# Patient Record
Sex: Male | Born: 1960 | Race: Black or African American | Hispanic: No | Marital: Single | Smoking: Current every day smoker
Health system: Southern US, Community
[De-identification: ages and names within clinical notes are randomized; demographics above are authoritative.]

## PROBLEM LIST (undated history)

## (undated) DIAGNOSIS — I10 Essential (primary) hypertension: Secondary | ICD-10-CM

## (undated) DIAGNOSIS — I639 Cerebral infarction, unspecified: Secondary | ICD-10-CM

## (undated) HISTORY — PX: BRAIN SURGERY: SHX531

## (undated) HISTORY — PX: NECK SURGERY: SHX720

## (undated) HISTORY — PX: ABDOMINAL SURGERY: SHX537

---

## 2009-01-13 ENCOUNTER — Emergency Department (HOSPITAL_COMMUNITY): Admission: EM | Admit: 2009-01-13 | Discharge: 2009-01-13 | Payer: Self-pay | Admitting: *Deleted

## 2009-01-15 ENCOUNTER — Emergency Department (HOSPITAL_COMMUNITY): Admission: EM | Admit: 2009-01-15 | Discharge: 2009-01-16 | Payer: Self-pay | Admitting: Emergency Medicine

## 2009-01-29 ENCOUNTER — Emergency Department (HOSPITAL_COMMUNITY): Admission: EM | Admit: 2009-01-29 | Discharge: 2009-01-29 | Payer: Self-pay | Admitting: Emergency Medicine

## 2009-01-31 ENCOUNTER — Emergency Department (HOSPITAL_COMMUNITY): Admission: EM | Admit: 2009-01-31 | Discharge: 2009-01-31 | Payer: Self-pay | Admitting: Emergency Medicine

## 2009-04-27 ENCOUNTER — Emergency Department (HOSPITAL_COMMUNITY): Admission: EM | Admit: 2009-04-27 | Discharge: 2009-04-27 | Payer: Self-pay | Admitting: Emergency Medicine

## 2010-02-17 ENCOUNTER — Emergency Department (HOSPITAL_COMMUNITY)
Admission: EM | Admit: 2010-02-17 | Discharge: 2010-02-17 | Payer: Self-pay | Source: Home / Self Care | Admitting: Emergency Medicine

## 2010-02-20 ENCOUNTER — Emergency Department (HOSPITAL_COMMUNITY)
Admission: EM | Admit: 2010-02-20 | Discharge: 2010-02-20 | Payer: Self-pay | Source: Home / Self Care | Admitting: Emergency Medicine

## 2010-02-24 ENCOUNTER — Emergency Department (HOSPITAL_COMMUNITY): Admission: EM | Admit: 2010-02-24 | Discharge: 2010-02-24 | Payer: Self-pay | Admitting: Family Medicine

## 2010-04-08 ENCOUNTER — Emergency Department (HOSPITAL_COMMUNITY): Admission: EM | Admit: 2010-04-08 | Discharge: 2010-04-08 | Payer: Self-pay | Admitting: Emergency Medicine

## 2010-05-23 ENCOUNTER — Encounter (INDEPENDENT_AMBULATORY_CARE_PROVIDER_SITE_OTHER): Payer: Self-pay

## 2010-05-23 ENCOUNTER — Inpatient Hospital Stay (HOSPITAL_COMMUNITY): Admission: AC | Admit: 2010-05-23 | Discharge: 2010-06-09 | Payer: Self-pay

## 2010-05-31 ENCOUNTER — Encounter (INDEPENDENT_AMBULATORY_CARE_PROVIDER_SITE_OTHER): Payer: Self-pay | Admitting: Surgery

## 2010-06-05 ENCOUNTER — Ambulatory Visit: Payer: Self-pay | Admitting: Physical Medicine & Rehabilitation

## 2010-07-04 ENCOUNTER — Ambulatory Visit: Payer: Self-pay | Admitting: Psychiatry

## 2010-07-05 ENCOUNTER — Encounter (HOSPITAL_BASED_OUTPATIENT_CLINIC_OR_DEPARTMENT_OTHER)
Admission: RE | Admit: 2010-07-05 | Discharge: 2010-07-14 | Payer: Self-pay | Source: Home / Self Care | Admitting: General Surgery

## 2010-07-14 ENCOUNTER — Emergency Department (HOSPITAL_COMMUNITY)
Admission: EM | Admit: 2010-07-14 | Discharge: 2010-07-14 | Payer: Self-pay | Source: Home / Self Care | Admitting: Emergency Medicine

## 2010-09-04 ENCOUNTER — Encounter
Admission: RE | Admit: 2010-09-04 | Discharge: 2010-09-04 | Payer: Self-pay | Source: Home / Self Care | Attending: Neurosurgery | Admitting: Neurosurgery

## 2010-09-04 ENCOUNTER — Encounter: Admission: RE | Admit: 2010-09-04 | Payer: Self-pay | Source: Home / Self Care | Admitting: Neurosurgery

## 2010-09-04 ENCOUNTER — Emergency Department (HOSPITAL_COMMUNITY)
Admission: EM | Admit: 2010-09-04 | Discharge: 2010-09-04 | Payer: Self-pay | Source: Home / Self Care | Admitting: Emergency Medicine

## 2010-09-05 LAB — COMPREHENSIVE METABOLIC PANEL
ALT: 33 U/L (ref 0–53)
AST: 31 U/L (ref 0–37)
Alkaline Phosphatase: 100 U/L (ref 39–117)
CO2: 26 mEq/L (ref 19–32)
Chloride: 101 mEq/L (ref 96–112)
Creatinine, Ser: 0.84 mg/dL (ref 0.4–1.5)
GFR calc Af Amer: >60 mL/min (ref >60)
GFR calc non Af Amer: >60 mL/min (ref >60)
Potassium: 3.8 mEq/L (ref 3.5–5.1)
Total Bilirubin: 0.2 mg/dL — ABNORMAL LOW (ref 0.3–1.2)

## 2010-09-05 LAB — URINALYSIS, ROUTINE W REFLEX MICROSCOPIC
Ketones, ur: 15 mg/dL — AB
Nitrite: POSITIVE — AB
Urobilinogen, UA: 1 mg/dL (ref 0.0–1.0)

## 2010-09-05 LAB — DIFFERENTIAL
Basophils Relative: 1 % (ref 0–1)
Lymphs Abs: 3.3 10*3/uL (ref 0.7–4.0)
Monocytes Relative: 9 % (ref 3–12)
Neutro Abs: 1.9 10*3/uL (ref 1.7–7.7)
Neutrophils Relative %: 33 % — ABNORMAL LOW (ref 43–77)

## 2010-09-05 LAB — CBC
Hemoglobin: 12.9 g/dL — ABNORMAL LOW (ref 13.0–17.0)
MCH: 31 pg (ref 26.0–34.0)
RBC: 4.16 MIL/uL — ABNORMAL LOW (ref 4.22–5.81)
WBC: 5.9 10*3/uL (ref 4.0–10.5)

## 2010-09-06 LAB — URINE CULTURE: Culture: NO GROWTH

## 2010-09-13 ENCOUNTER — Encounter: Payer: Self-pay | Admitting: Neurosurgery

## 2010-10-25 LAB — BASIC METABOLIC PANEL
BUN: 13 mg/dL (ref 6–23)
BUN: 15 mg/dL (ref 6–23)
BUN: 16 mg/dL (ref 6–23)
CO2: 25 mEq/L (ref 19–32)
CO2: 29 mEq/L (ref 19–32)
Calcium: 8 mg/dL — ABNORMAL LOW (ref 8.4–10.5)
Calcium: 8 mg/dL — ABNORMAL LOW (ref 8.4–10.5)
Calcium: 8.1 mg/dL — ABNORMAL LOW (ref 8.4–10.5)
Calcium: 8.3 mg/dL — ABNORMAL LOW (ref 8.4–10.5)
Chloride: 109 mEq/L (ref 96–112)
Chloride: 110 mEq/L (ref 96–112)
Chloride: 111 mEq/L (ref 96–112)
Creatinine, Ser: 0.77 mg/dL (ref 0.4–1.5)
Creatinine, Ser: 0.79 mg/dL (ref 0.4–1.5)
Creatinine, Ser: 0.83 mg/dL (ref 0.4–1.5)
GFR calc Af Amer: >60 mL/min (ref >60)
GFR calc Af Amer: >60 mL/min (ref >60)
GFR calc Af Amer: >60 mL/min (ref >60)
GFR calc Af Amer: >60 mL/min (ref >60)
GFR calc non Af Amer: >60 mL/min (ref >60)
GFR calc non Af Amer: >60 mL/min (ref >60)
GFR calc non Af Amer: >60 mL/min (ref >60)
GFR calc non Af Amer: >60 mL/min (ref >60)
GFR calc non Af Amer: >60 mL/min (ref >60)
GFR calc non Af Amer: >60 mL/min (ref >60)
Glucose, Bld: 110 mg/dL — ABNORMAL HIGH (ref 70–99)
Glucose, Bld: 118 mg/dL — ABNORMAL HIGH (ref 70–99)
Glucose, Bld: 133 mg/dL — ABNORMAL HIGH (ref 70–99)
Glucose, Bld: 136 mg/dL — ABNORMAL HIGH (ref 70–99)
Potassium: 3.2 mEq/L — ABNORMAL LOW (ref 3.5–5.1)
Potassium: 3.3 mEq/L — ABNORMAL LOW (ref 3.5–5.1)
Potassium: 3.5 mEq/L (ref 3.5–5.1)
Potassium: 3.7 mEq/L (ref 3.5–5.1)
Potassium: 3.9 mEq/L (ref 3.5–5.1)
Potassium: 4 mEq/L (ref 3.5–5.1)
Sodium: 136 mEq/L (ref 135–145)
Sodium: 141 mEq/L (ref 135–145)
Sodium: 142 mEq/L (ref 135–145)
Sodium: 142 mEq/L (ref 135–145)
Sodium: 144 mEq/L (ref 135–145)

## 2010-10-25 LAB — GLUCOSE, CAPILLARY
Glucose-Capillary: 103 mg/dL — ABNORMAL HIGH (ref 70–99)
Glucose-Capillary: 104 mg/dL — ABNORMAL HIGH (ref 70–99)
Glucose-Capillary: 104 mg/dL — ABNORMAL HIGH (ref 70–99)
Glucose-Capillary: 107 mg/dL — ABNORMAL HIGH (ref 70–99)
Glucose-Capillary: 108 mg/dL — ABNORMAL HIGH (ref 70–99)
Glucose-Capillary: 109 mg/dL — ABNORMAL HIGH (ref 70–99)
Glucose-Capillary: 111 mg/dL — ABNORMAL HIGH (ref 70–99)
Glucose-Capillary: 112 mg/dL — ABNORMAL HIGH (ref 70–99)
Glucose-Capillary: 114 mg/dL — ABNORMAL HIGH (ref 70–99)
Glucose-Capillary: 116 mg/dL — ABNORMAL HIGH (ref 70–99)
Glucose-Capillary: 119 mg/dL — ABNORMAL HIGH (ref 70–99)
Glucose-Capillary: 121 mg/dL — ABNORMAL HIGH (ref 70–99)
Glucose-Capillary: 122 mg/dL — ABNORMAL HIGH (ref 70–99)
Glucose-Capillary: 122 mg/dL — ABNORMAL HIGH (ref 70–99)
Glucose-Capillary: 122 mg/dL — ABNORMAL HIGH (ref 70–99)
Glucose-Capillary: 124 mg/dL — ABNORMAL HIGH (ref 70–99)
Glucose-Capillary: 127 mg/dL — ABNORMAL HIGH (ref 70–99)
Glucose-Capillary: 129 mg/dL — ABNORMAL HIGH (ref 70–99)
Glucose-Capillary: 132 mg/dL — ABNORMAL HIGH (ref 70–99)
Glucose-Capillary: 136 mg/dL — ABNORMAL HIGH (ref 70–99)
Glucose-Capillary: 146 mg/dL — ABNORMAL HIGH (ref 70–99)
Glucose-Capillary: 162 mg/dL — ABNORMAL HIGH (ref 70–99)
Glucose-Capillary: 191 mg/dL — ABNORMAL HIGH (ref 70–99)
Glucose-Capillary: 74 mg/dL (ref 70–99)
Glucose-Capillary: 91 mg/dL (ref 70–99)
Glucose-Capillary: 94 mg/dL (ref 70–99)

## 2010-10-25 LAB — BLOOD GAS, ARTERIAL
Acid-Base Excess: 2.1 mmol/L — ABNORMAL HIGH (ref 0.0–2.0)
Bicarbonate: 26.2 meq/L — ABNORMAL HIGH (ref 20.0–24.0)
FIO2: 40 %
Patient temperature: 98.6
TCO2: 27.4 mmol/L (ref 0–100)
pH, Arterial: 7.418 (ref 7.350–7.450)

## 2010-10-25 LAB — POCT I-STAT 4, (NA,K, GLUC, HGB,HCT)
HCT: 31 % — ABNORMAL LOW (ref 39.0–52.0)
Hemoglobin: 10.5 g/dL — ABNORMAL LOW (ref 13.0–17.0)
Potassium: 4.7 meq/L (ref 3.5–5.1)
Sodium: 135 meq/L (ref 135–145)

## 2010-10-25 LAB — CBC
HCT: 22.3 % — ABNORMAL LOW (ref 39.0–52.0)
HCT: 24.5 % — ABNORMAL LOW (ref 39.0–52.0)
HCT: 25.9 % — ABNORMAL LOW (ref 39.0–52.0)
HCT: 27.4 % — ABNORMAL LOW (ref 39.0–52.0)
HCT: 27.7 % — ABNORMAL LOW (ref 39.0–52.0)
Hemoglobin: 7.4 g/dL — ABNORMAL LOW (ref 13.0–17.0)
Hemoglobin: 8.1 g/dL — ABNORMAL LOW (ref 13.0–17.0)
Hemoglobin: 8.5 g/dL — ABNORMAL LOW (ref 13.0–17.0)
Hemoglobin: 8.8 g/dL — ABNORMAL LOW (ref 13.0–17.0)
Hemoglobin: 9 g/dL — ABNORMAL LOW (ref 13.0–17.0)
Hemoglobin: 9.1 g/dL — ABNORMAL LOW (ref 13.0–17.0)
Hemoglobin: 9.1 g/dL — ABNORMAL LOW (ref 13.0–17.0)
MCH: 33 pg (ref 26.0–34.0)
MCH: 33.8 pg (ref 26.0–34.0)
MCHC: 32.9 g/dL (ref 30.0–36.0)
MCHC: 33.8 g/dL (ref 30.0–36.0)
MCV: 102.5 fL — ABNORMAL HIGH (ref 78.0–100.0)
MCV: 99.6 fL (ref 78.0–100.0)
Platelets: 210 10*3/uL (ref 150–400)
Platelets: 574 10*3/uL — ABNORMAL HIGH (ref 150–400)
RBC: 2.39 MIL/uL — ABNORMAL LOW (ref 4.22–5.81)
RBC: 2.75 MIL/uL — ABNORMAL LOW (ref 4.22–5.81)
RBC: 2.76 MIL/uL — ABNORMAL LOW (ref 4.22–5.81)
RBC: 2.81 MIL/uL — ABNORMAL LOW (ref 4.22–5.81)
RDW: 12.7 % (ref 11.5–15.5)
RDW: 12.8 % (ref 11.5–15.5)
RDW: 13 % (ref 11.5–15.5)
RDW: 13 % (ref 11.5–15.5)
RDW: 13.2 % (ref 11.5–15.5)
WBC: 10.2 10*3/uL (ref 4.0–10.5)
WBC: 11 10*3/uL — ABNORMAL HIGH (ref 4.0–10.5)
WBC: 6.9 10*3/uL (ref 4.0–10.5)
WBC: 7.8 10*3/uL (ref 4.0–10.5)
WBC: 8.2 10*3/uL (ref 4.0–10.5)
WBC: 9.1 10*3/uL (ref 4.0–10.5)

## 2010-10-25 LAB — POCT I-STAT 3, ART BLOOD GAS (G3+)
Acid-Base Excess: 4 mmol/L — ABNORMAL HIGH (ref 0.0–2.0)
Patient temperature: 37.6
pH, Arterial: 7.424 (ref 7.350–7.450)
pO2, Arterial: 71 mmHg — ABNORMAL LOW (ref 80.0–100.0)

## 2010-10-25 LAB — MRSA PCR SCREENING: MRSA by PCR: NEGATIVE

## 2010-10-25 LAB — COMPREHENSIVE METABOLIC PANEL
Alkaline Phosphatase: 81 U/L (ref 39–117)
BUN: 16 mg/dL (ref 6–23)
CO2: 28 mEq/L (ref 19–32)
Chloride: 111 mEq/L (ref 96–112)
GFR calc non Af Amer: >60 mL/min (ref >60)
Glucose, Bld: 162 mg/dL — ABNORMAL HIGH (ref 70–99)
Potassium: 3.8 mEq/L (ref 3.5–5.1)
Total Bilirubin: 0.5 mg/dL (ref 0.3–1.2)

## 2010-10-26 LAB — CBC
HCT: 23.8 % — ABNORMAL LOW (ref 39.0–52.0)
HCT: 27.2 % — ABNORMAL LOW (ref 39.0–52.0)
Hemoglobin: 8 g/dL — ABNORMAL LOW (ref 13.0–17.0)
Hemoglobin: 9 g/dL — ABNORMAL LOW (ref 13.0–17.0)
MCH: 33.3 pg (ref 26.0–34.0)
MCH: 33.7 pg (ref 26.0–34.0)
MCH: 34 pg (ref 26.0–34.0)
MCHC: 33.1 g/dL (ref 30.0–36.0)
MCHC: 33.6 g/dL (ref 30.0–36.0)
MCHC: 33.7 g/dL (ref 30.0–36.0)
MCHC: 34.2 g/dL (ref 30.0–36.0)
MCHC: 35.1 g/dL (ref 30.0–36.0)
MCV: 100 fL (ref 78.0–100.0)
MCV: 101.3 fL — ABNORMAL HIGH (ref 78.0–100.0)
Platelets: 188 10*3/uL (ref 150–400)
RBC: 2.7 MIL/uL — ABNORMAL LOW (ref 4.22–5.81)
RBC: 3.44 MIL/uL — ABNORMAL LOW (ref 4.22–5.81)
RDW: 12.5 % (ref 11.5–15.5)
RDW: 12.6 % (ref 11.5–15.5)
RDW: 12.8 % (ref 11.5–15.5)
RDW: 12.8 % (ref 11.5–15.5)
WBC: 11.3 10*3/uL — ABNORMAL HIGH (ref 4.0–10.5)

## 2010-10-26 LAB — POCT I-STAT, CHEM 8
Calcium, Ion: 1 mmol/L — ABNORMAL LOW (ref 1.12–1.32)
Chloride: 106 meq/L (ref 96–112)
Glucose, Bld: 190 mg/dL — ABNORMAL HIGH (ref 70–99)
HCT: 47 % (ref 39.0–52.0)

## 2010-10-26 LAB — PROTIME-INR
INR: 1.16 (ref 0.00–1.49)
Prothrombin Time: 13.5 seconds (ref 11.6–15.2)
Prothrombin Time: 15 seconds (ref 11.6–15.2)

## 2010-10-26 LAB — DIFFERENTIAL
Basophils Absolute: 0 10*3/uL (ref 0.0–0.1)
Basophils Relative: 0 % (ref 0–1)
Basophils Relative: 0 % (ref 0–1)
Eosinophils Absolute: 0 10*3/uL (ref 0.0–0.7)
Eosinophils Absolute: 0 10*3/uL (ref 0.0–0.7)
Eosinophils Relative: 0 % (ref 0–5)
Lymphs Abs: 1.9 10*3/uL (ref 0.7–4.0)
Monocytes Absolute: 0.7 10*3/uL (ref 0.1–1.0)
Monocytes Absolute: 0.8 10*3/uL (ref 0.1–1.0)
Monocytes Relative: 8 % (ref 3–12)
Monocytes Relative: 9 % (ref 3–12)
Neutro Abs: 6.3 10*3/uL (ref 1.7–7.7)
Neutro Abs: 6.6 10*3/uL (ref 1.7–7.7)
Neutrophils Relative %: 69 % (ref 43–77)

## 2010-10-26 LAB — CULTURE, BAL-QUANTITATIVE W GRAM STAIN

## 2010-10-26 LAB — BASIC METABOLIC PANEL
BUN: 11 mg/dL (ref 6–23)
BUN: 14 mg/dL (ref 6–23)
BUN: 17 mg/dL (ref 6–23)
CO2: 27 mEq/L (ref 19–32)
CO2: 29 mEq/L (ref 19–32)
CO2: 29 mEq/L (ref 19–32)
Calcium: 7.6 mg/dL — ABNORMAL LOW (ref 8.4–10.5)
Calcium: 7.8 mg/dL — ABNORMAL LOW (ref 8.4–10.5)
Calcium: 7.9 mg/dL — ABNORMAL LOW (ref 8.4–10.5)
Chloride: 106 mEq/L (ref 96–112)
Creatinine, Ser: 1.14 mg/dL (ref 0.4–1.5)
Creatinine, Ser: 1.14 mg/dL (ref 0.4–1.5)
GFR calc Af Amer: >60 mL/min (ref >60)
GFR calc Af Amer: >60 mL/min (ref >60)
GFR calc non Af Amer: >60 mL/min (ref >60)
Glucose, Bld: 104 mg/dL — ABNORMAL HIGH (ref 70–99)
Glucose, Bld: 114 mg/dL — ABNORMAL HIGH (ref 70–99)
Glucose, Bld: 116 mg/dL — ABNORMAL HIGH (ref 70–99)
Glucose, Bld: 118 mg/dL — ABNORMAL HIGH (ref 70–99)
Potassium: 3.5 mEq/L (ref 3.5–5.1)
Potassium: 4.3 mEq/L (ref 3.5–5.1)
Sodium: 139 mEq/L (ref 135–145)
Sodium: 140 mEq/L (ref 135–145)

## 2010-10-26 LAB — POCT I-STAT 7, (LYTES, BLD GAS, ICA,H+H)
Acid-base deficit: 4 mmol/L — ABNORMAL HIGH (ref 0.0–2.0)
Bicarbonate: 22.4 meq/L (ref 20.0–24.0)
Calcium, Ion: 1.04 mmol/L — ABNORMAL LOW (ref 1.12–1.32)
HCT: 38 % — ABNORMAL LOW (ref 39.0–52.0)
HCT: 42 % (ref 39.0–52.0)
O2 Saturation: 94 %
Patient temperature: 35.5
Sodium: 142 meq/L (ref 135–145)
pCO2 arterial: 36 mmHg (ref 35.0–45.0)
pO2, Arterial: 69 mmHg — ABNORMAL LOW (ref 80.0–100.0)

## 2010-10-26 LAB — POCT I-STAT 3, ART BLOOD GAS (G3+)
Acid-Base Excess: 4 mmol/L — ABNORMAL HIGH (ref 0.0–2.0)
Acid-Base Excess: 4 mmol/L — ABNORMAL HIGH (ref 0.0–2.0)
Bicarbonate: 24.7 meq/L — ABNORMAL HIGH (ref 20.0–24.0)
Bicarbonate: 26.8 meq/L — ABNORMAL HIGH (ref 20.0–24.0)
O2 Saturation: 90 %
O2 Saturation: 92 %
O2 Saturation: 92 %
O2 Saturation: 96 %
O2 Saturation: 96 %
Patient temperature: 37.5
Patient temperature: 37.6
Patient temperature: 96.5
TCO2: 22 mmol/L (ref 0–100)
TCO2: 26 mmol/L (ref 0–100)
TCO2: 28 mmol/L (ref 0–100)
TCO2: 30 mmol/L (ref 0–100)
pCO2 arterial: 43.2 mmHg (ref 35.0–45.0)
pCO2 arterial: 50.5 mmHg — ABNORMAL HIGH (ref 35.0–45.0)
pH, Arterial: 7.367 (ref 7.350–7.450)
pH, Arterial: 7.422 (ref 7.350–7.450)
pO2, Arterial: 104 mmHg — ABNORMAL HIGH (ref 80.0–100.0)
pO2, Arterial: 61 mmHg — ABNORMAL LOW (ref 80.0–100.0)
pO2, Arterial: 85 mmHg (ref 80.0–100.0)

## 2010-10-26 LAB — COMPREHENSIVE METABOLIC PANEL
ALT: 70 U/L — ABNORMAL HIGH (ref 0–53)
AST: 84 U/L — ABNORMAL HIGH (ref 0–37)
Albumin: 3.7 g/dL (ref 3.5–5.2)
Calcium: 8.3 mg/dL — ABNORMAL LOW (ref 8.4–10.5)
GFR calc Af Amer: >60 mL/min (ref >60)
Potassium: 3.5 mEq/L (ref 3.5–5.1)
Sodium: 140 mEq/L (ref 135–145)
Total Protein: 7.9 g/dL (ref 6.0–8.3)

## 2010-10-26 LAB — URINALYSIS, ROUTINE W REFLEX MICROSCOPIC
Nitrite: NEGATIVE
Specific Gravity, Urine: 1.035 — ABNORMAL HIGH (ref 1.005–1.030)
pH: 6 (ref 5.0–8.0)

## 2010-10-26 LAB — LACTIC ACID, PLASMA: Lactic Acid, Venous: 4 mmol/L — ABNORMAL HIGH (ref 0.5–2.2)

## 2010-10-26 LAB — TYPE AND SCREEN
ABO/RH(D): O NEG
Antibody Screen: NEGATIVE

## 2010-10-26 LAB — URINE MICROSCOPIC-ADD ON

## 2010-10-26 LAB — HEMOGLOBIN AND HEMATOCRIT, BLOOD: HCT: 36.7 % — ABNORMAL LOW (ref 39.0–52.0)

## 2011-06-12 ENCOUNTER — Emergency Department (HOSPITAL_COMMUNITY)
Admission: EM | Admit: 2011-06-12 | Discharge: 2011-06-12 | Disposition: A | Discharge status: Home / Self Care | Payer: Medicare Other | Attending: Emergency Medicine | Admitting: Emergency Medicine

## 2011-06-12 DIAGNOSIS — M79609 Pain in unspecified limb: Secondary | ICD-10-CM

## 2011-06-12 DIAGNOSIS — I10 Essential (primary) hypertension: Secondary | ICD-10-CM | POA: Insufficient documentation

## 2011-06-12 DIAGNOSIS — Z79899 Other long term (current) drug therapy: Secondary | ICD-10-CM | POA: Insufficient documentation

## 2011-06-12 DIAGNOSIS — M25569 Pain in unspecified knee: Secondary | ICD-10-CM | POA: Insufficient documentation

## 2011-06-12 DIAGNOSIS — G8929 Other chronic pain: Secondary | ICD-10-CM | POA: Insufficient documentation

## 2011-06-12 DIAGNOSIS — M549 Dorsalgia, unspecified: Secondary | ICD-10-CM | POA: Insufficient documentation

## 2011-12-06 ENCOUNTER — Emergency Department (HOSPITAL_COMMUNITY)
Admission: EM | Admit: 2011-12-06 | Discharge: 2011-12-06 | Disposition: A | Discharge status: Home / Self Care | Payer: Medicare Other | Attending: Emergency Medicine | Admitting: Emergency Medicine

## 2011-12-06 ENCOUNTER — Encounter (HOSPITAL_COMMUNITY): Payer: Self-pay | Admitting: Emergency Medicine

## 2011-12-06 ENCOUNTER — Emergency Department (HOSPITAL_COMMUNITY): Payer: Medicare Other

## 2011-12-06 DIAGNOSIS — I10 Essential (primary) hypertension: Secondary | ICD-10-CM | POA: Insufficient documentation

## 2011-12-06 DIAGNOSIS — Z79899 Other long term (current) drug therapy: Secondary | ICD-10-CM | POA: Insufficient documentation

## 2011-12-06 DIAGNOSIS — M25519 Pain in unspecified shoulder: Secondary | ICD-10-CM | POA: Insufficient documentation

## 2011-12-06 DIAGNOSIS — M25512 Pain in left shoulder: Secondary | ICD-10-CM

## 2011-12-06 DIAGNOSIS — X500XXA Overexertion from strenuous movement or load, initial encounter: Secondary | ICD-10-CM | POA: Insufficient documentation

## 2011-12-06 HISTORY — DX: Essential (primary) hypertension: I10

## 2011-12-06 MED ORDER — OXYCODONE-ACETAMINOPHEN 5-325 MG PO TABS
1.0000 | ORAL_TABLET | Freq: Once | ORAL | Status: AC
  Administered 2011-12-06: 1 via ORAL
  Filled 2011-12-06: qty 1

## 2011-12-06 MED ORDER — OXYCODONE-ACETAMINOPHEN 5-325 MG PO TABS: 1.0000 | ORAL_TABLET | Freq: Four times a day (QID) | ORAL | Status: AC | PRN

## 2011-12-06 NOTE — ED Notes (Signed)
Pt c/o left shoulder pain after lifting a ladder yesterday; pt sts painful to touch or move

## 2011-12-06 NOTE — Discharge Instructions (Signed)
Return to the ED with any concerns including worsening pain, arm swelling, weakness or numbness of her arm, or any other alarming symptoms.  He should be sure to arrange for a followup appointment with your primary care provider as well as with orthopedic surgery if your symptoms persist.

## 2011-12-06 NOTE — ED Provider Notes (Signed)
History     CSN: [id number]  Arrival date & time 12/06/11  1119   First MD Initiated Contact with Patient 12/06/11 1200      Chief Complaint  Patient presents with  . Shoulder Pain    (Consider location/radiation/quality/duration/timing/severity/associated sxs/prior treatment) HPI Patient presents with pain in his left shoulder. He states that he was helping to lift gallbladder and felt that it was blowing against the wind which pulled on his left shoulder. He did not fall or have any direct trauma to the shoulder. Since yesterday his pain has been worsening and is in the anterior and posterior shoulder. It is worse with movement. He took an over-the-counter pain medication which did not help with his symptoms. He denies any swelling of arm. He has no neck or back pain. Movement and palpation make the pain worse. There no other alleviating or modifying factors. There no associated systemic symptoms.  Past Medical History  Diagnosis Date  . Hypertension     History reviewed. No pertinent past surgical history.  History reviewed. No pertinent family history.  History  Substance Use Topics  . Smoking status: Current Everyday Smoker  . Smokeless tobacco: Not on file  . Alcohol Use: No      Review of Systems ROS reviewed and all otherwise negative except for mentioned in HPI  Allergies  Review of patient's allergies indicates no known allergies.  Home Medications   Current Outpatient Rx  Name Route Sig Dispense Refill  . HYDROCHLOROTHIAZIDE 25 MG PO TABS Oral Take 25 mg by mouth daily.    Marland Kitchen NIFEDIPINE ER 60 MG PO TB24 Oral Take 60 mg by mouth daily.    . OXYCODONE-ACETAMINOPHEN 5-325 MG PO TABS Oral Take 1-2 tablets by mouth every 6 (six) hours as needed for pain. 15 tablet 0    BP 161/94  Pulse 85  Temp(Src) 97.6 F (36.4 C) (Oral)  Resp 18  SpO2 94% Vitals reviewed Physical Exam Physical Examination: General appearance - alert, well appearing, and in no  distress Mental status - alert, oriented to person, place, and time Neck - supple, no midline ttp with FROM Chest - clear to auscultation, no wheezes, rales or rhonchi, symmetric air entry Heart - normal rate, regular rhythm, normal S1, S2, no murmurs, rubs, clicks or gallops Back exam - no midline ttp of c/t/l spine Neurological - alert, oriented, normal speech, full strength 5/5 in bilateral upper extremities, sensation distally instact Musculoskeletal - ttp diffusely about left shoulder joint, no specific bony point tenderness, no ttp of AC joint, pain with passive and active ROM,  no deformity or swelling Extremities - peripheral pulses normal, no pedal edema, no clubbing or cyanosis Skin - normal coloration and turgor, no rashes ED Course  Procedures (including critical care time)  Labs Reviewed - No data to display Dg Shoulder Left  12/06/2011  *RADIOLOGY REPORT*  Clinical Data: Work injury.  Swelling with limited mobility.  LEFT SHOULDER - 2+ VIEW  Comparison:  01/13/2009  Findings:  There is no evidence of fracture or dislocation.  There is no evidence of arthropathy or other focal bone abnormality. Soft tissues are unremarkable. No change from priors.  IMPRESSION: Negative.  Original Report Authenticated By: Elsie Stain, M.D.     1. Shoulder pain, left   2. Hypertension       MDM  Patient presenting with pain in his left shoulder after a strain on the shoulder lifting a ladder yesterday. His x-ray shows no acute bony  abnormality. He was given pain medication in the emergency department and is discharged with strict return precautions. He is agreeable with this plan. He was also given information for orthopedic followup if symptoms persist.        Ethelda Chick, MD 12/06/11 1254

## 2012-01-18 IMAGING — CR DG HAND COMPLETE 3+V*L*
3 series · 3 of 3 positions shown · non-contrast
Comparison: Left wrist films

CLINICAL DATA: Left hand pain with swelling, fall

LEFT HAND - COMPLETE 3+ VIEW

[x hand pa left]
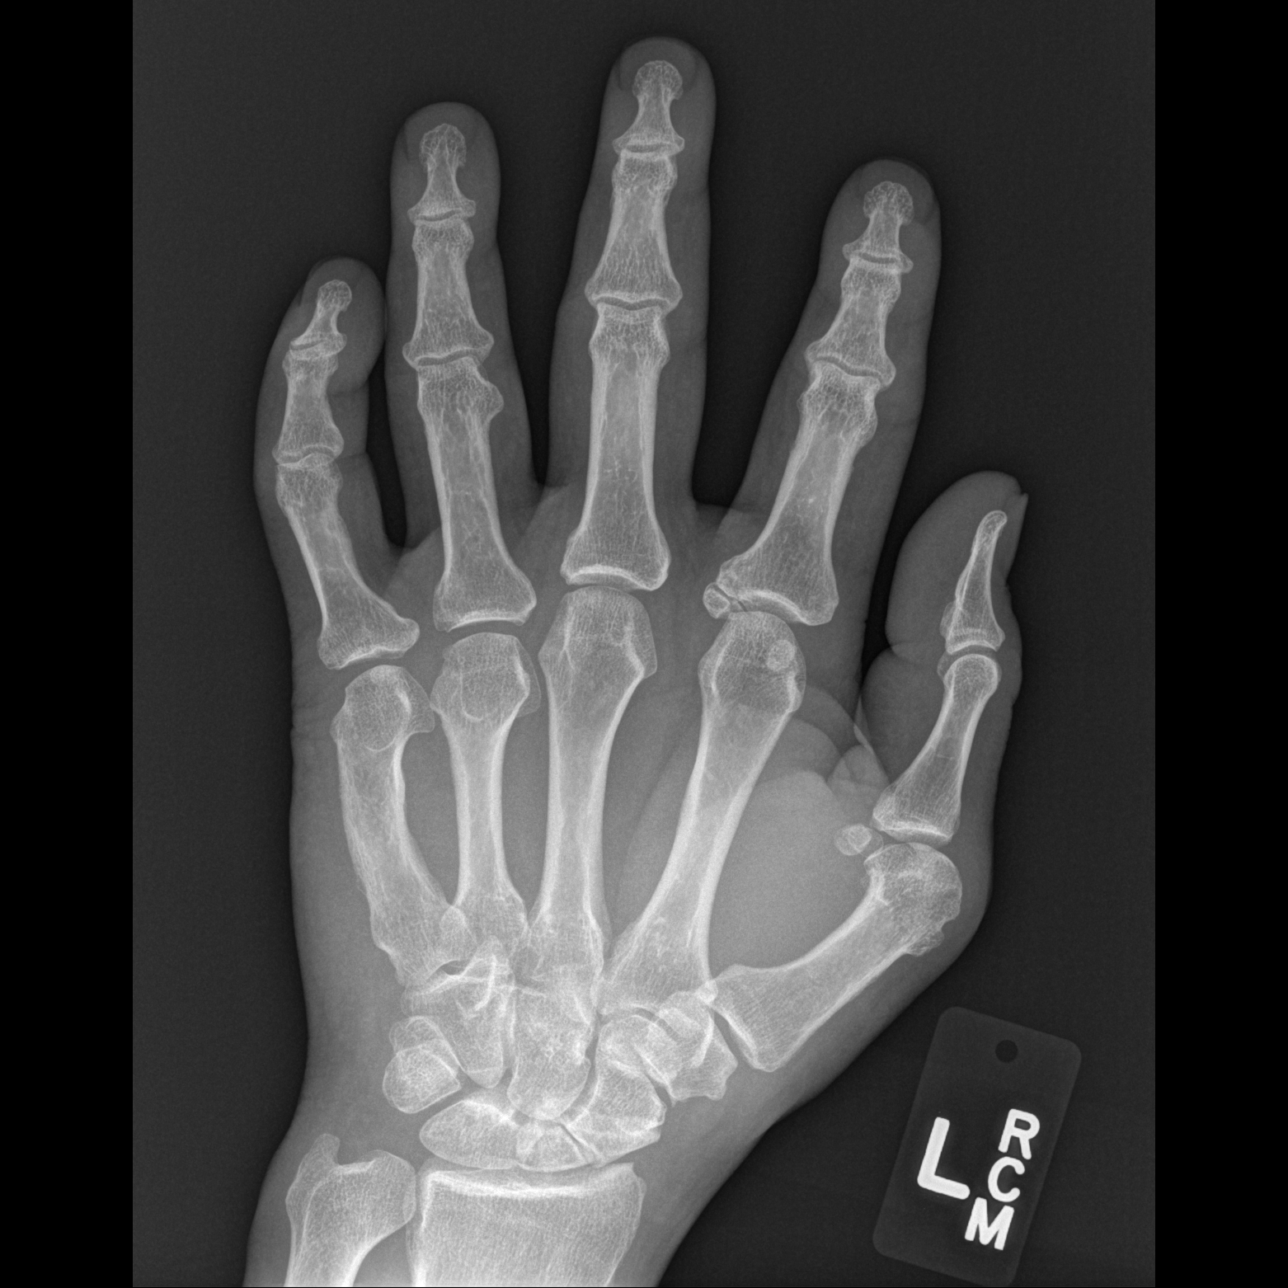

[x hand oblique left]
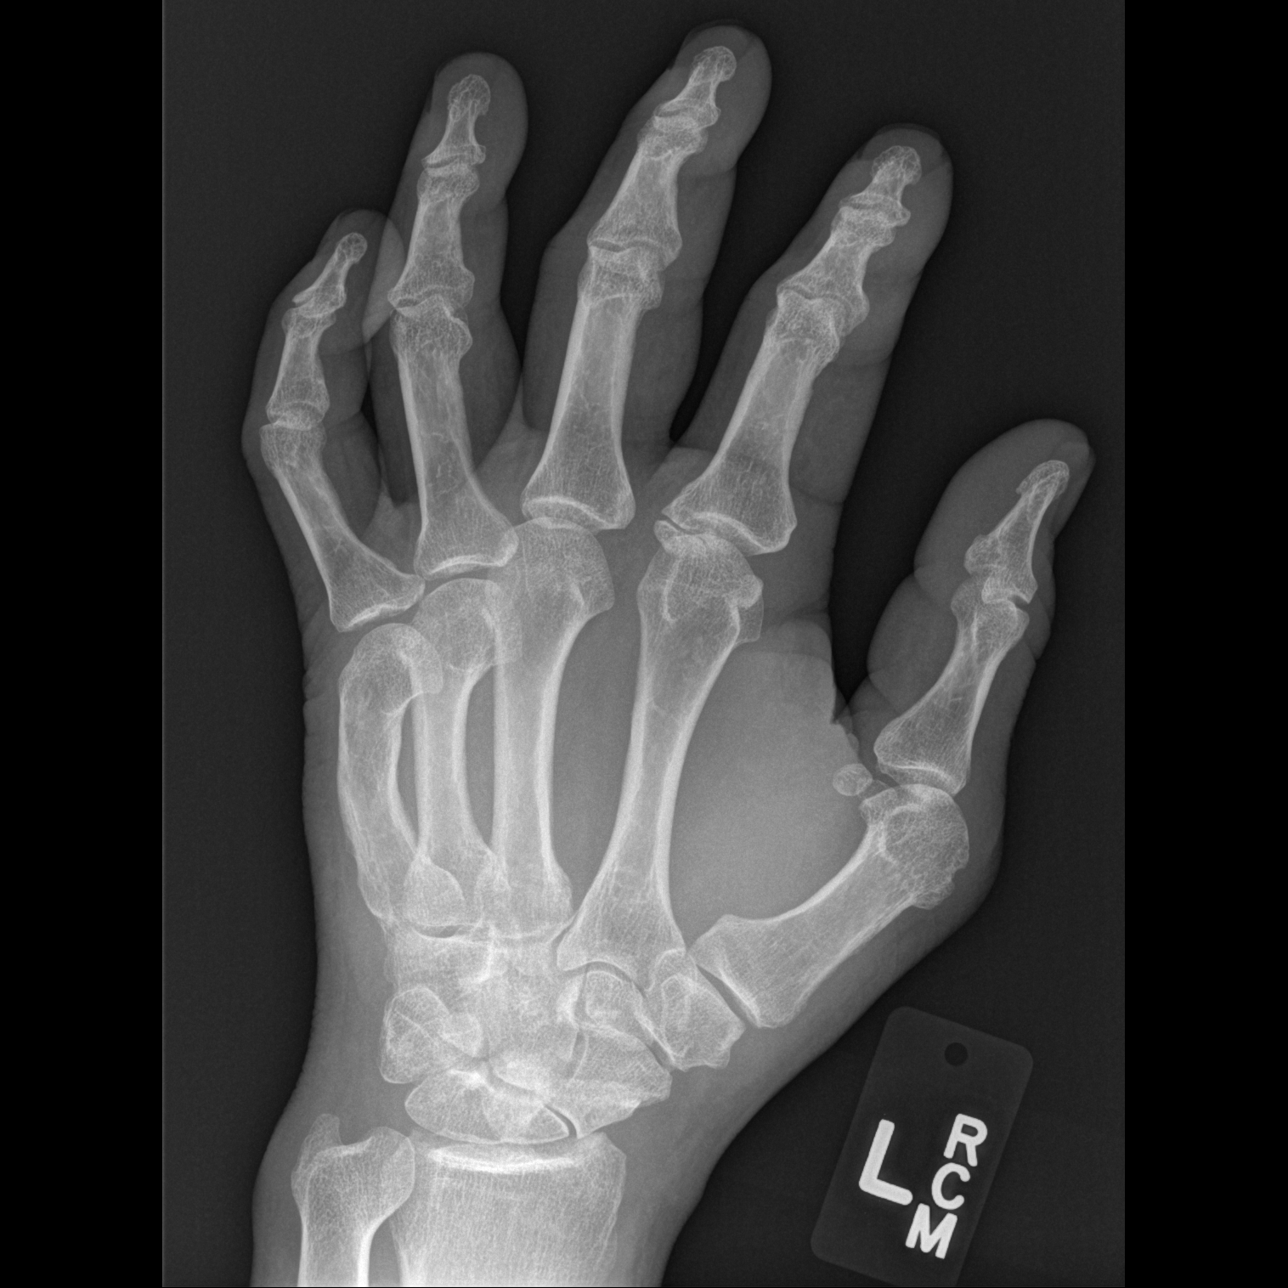

[x hand lat left]
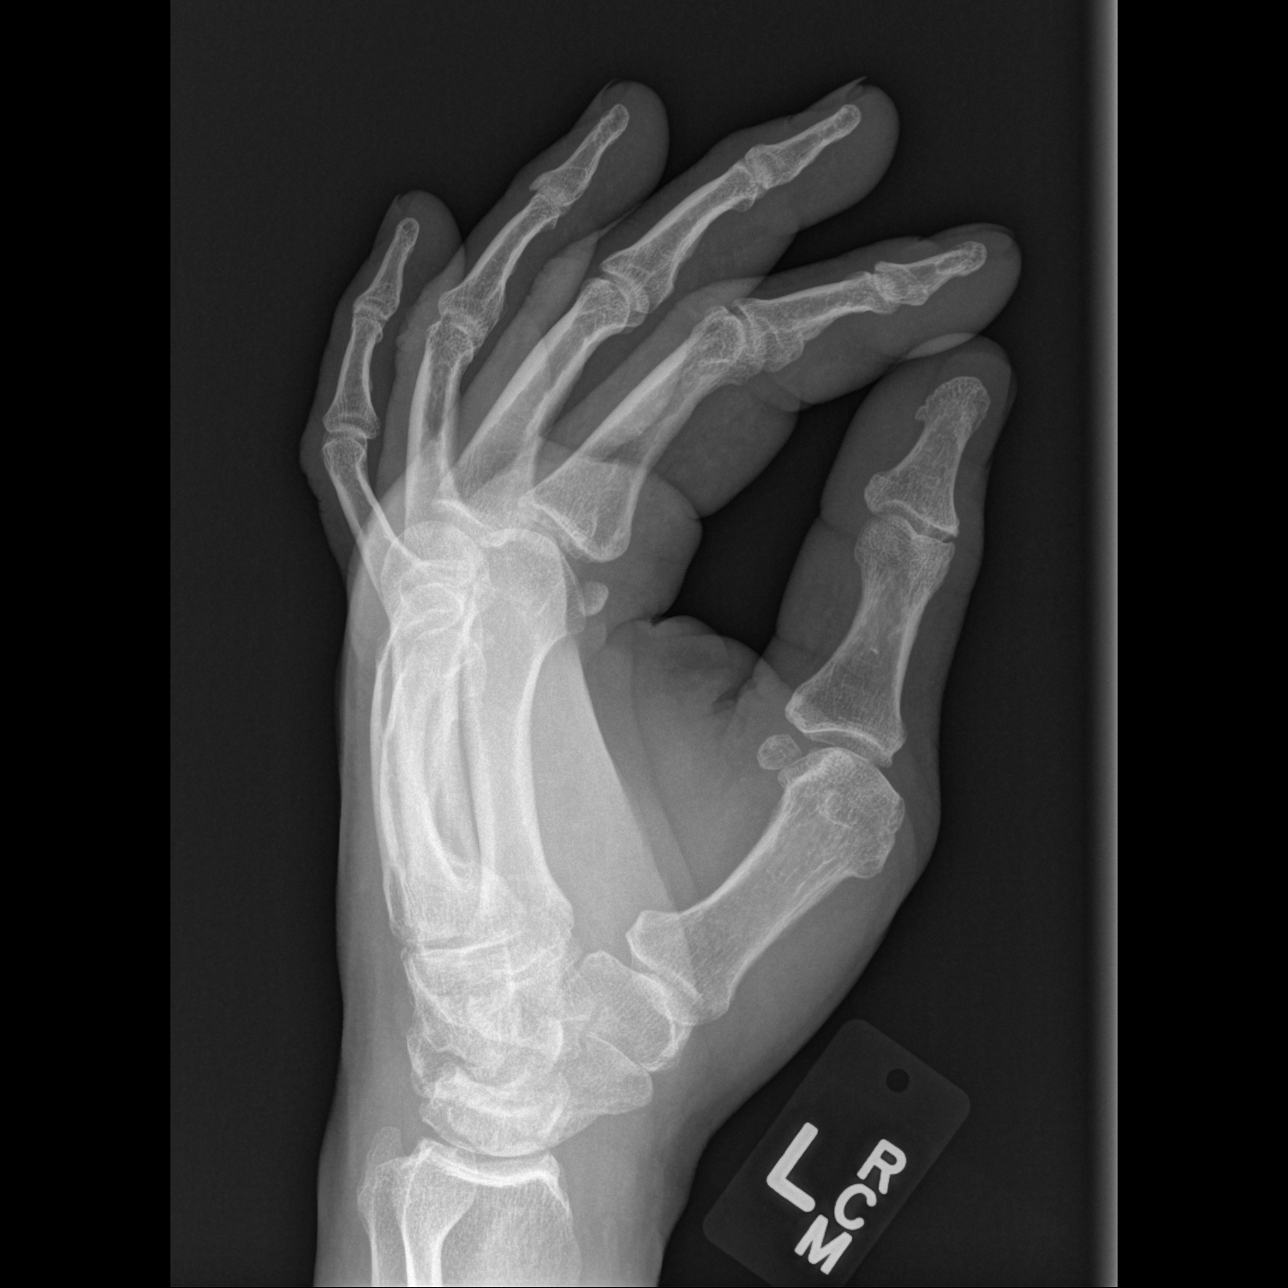

[3 of 3 positions shown; findings below may reference images not displayed]

FINDINGS: Mild degenerative change.  No acute fracture or
dislocation.
IMPRESSION: As above.

## 2012-03-03 IMAGING — CT CT ABD-PELV W/ CM
3 of 8 series · 13 of 32 positions shown, 18 images · IV contrast (100ml omni 300)
Comparison: Chest x-ray and pelvic x-ray 05/23/2010

CT CHEST

CLINICAL DATA: Level I trauma.  Rollover.  Pain.

CT CHEST, ABDOMEN AND PELVIS WITH CONTRAST
TECHNIQUE: Multidetector CT imaging of the chest, abdomen and
pelvis was performed following the standard protocol during bolus
administration of intravenous contrast.
Contrast: 100 ml Omnipaque 300

[Series 103: reformatted · sagittal · 0.90mm/px · 5 of 190 slices shown (1 of 3)]
[im 32/190  soft-tissue]
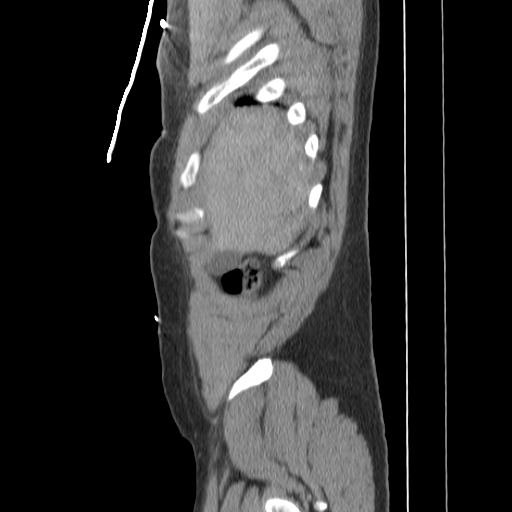
[im 64/190  soft-tissue]
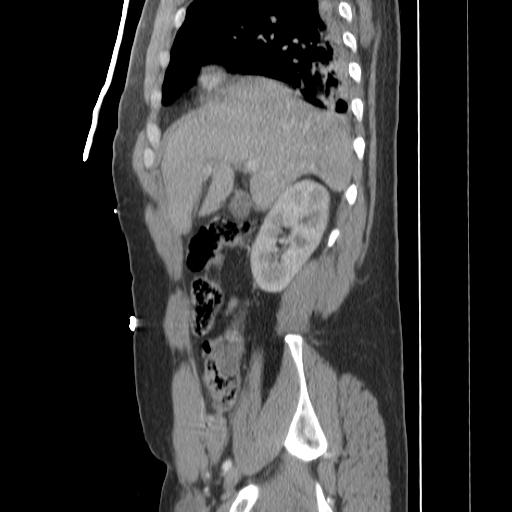
[im 95/190  soft-tissue]
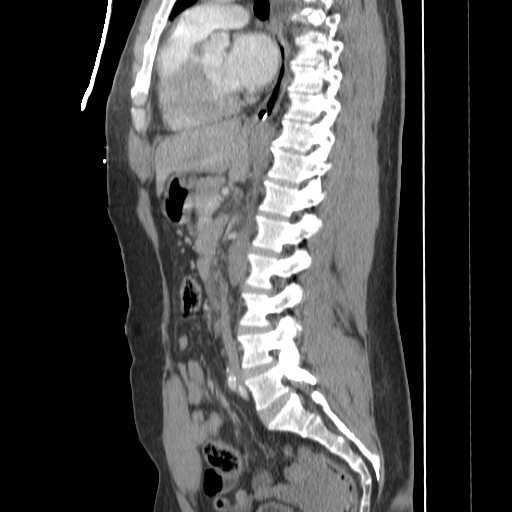
[im 127/190  soft-tissue]
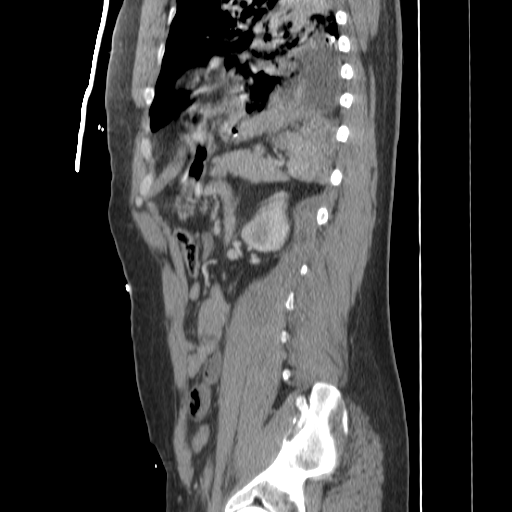
[im 158/190  soft-tissue]
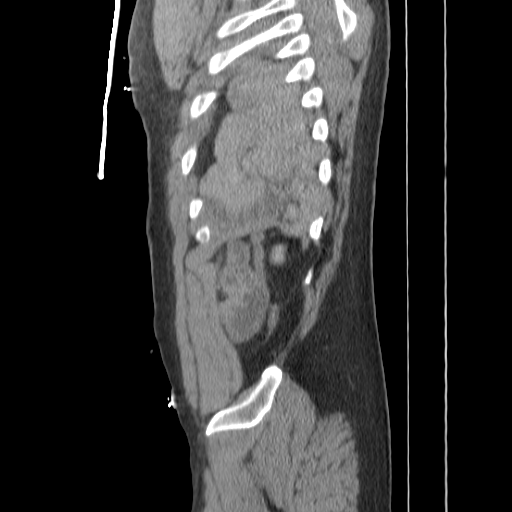

[Series 105: reformatted · sagittal · 1.30mm/px · 5 of 187 slices shown, 10 images (2 of 3)]
[im 32/187  soft-tissue]
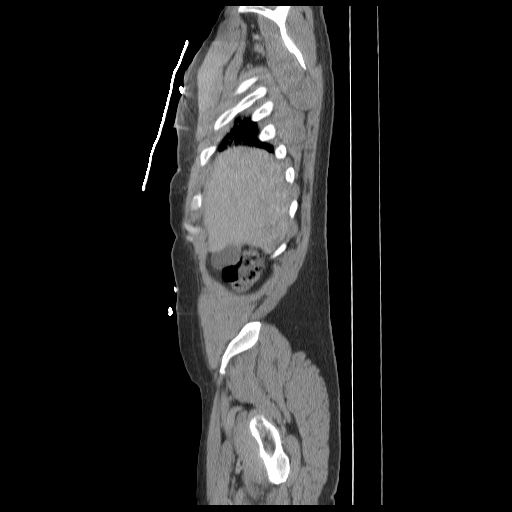
[im 32/187  lung]
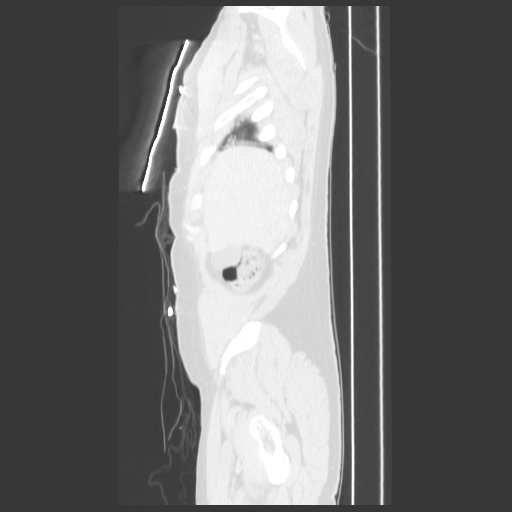
[im 32/187  bone]
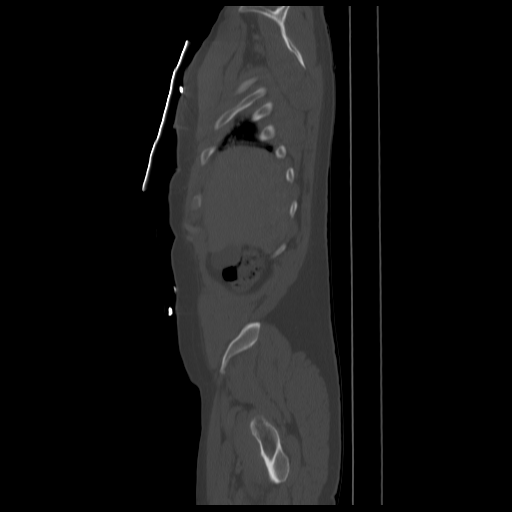
[im 63/187  soft-tissue]
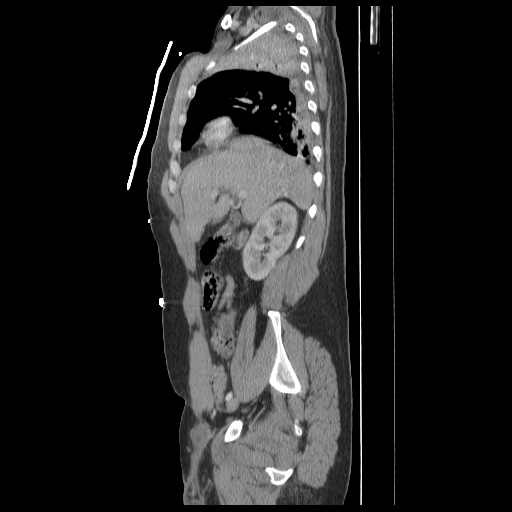
[im 63/187  lung]
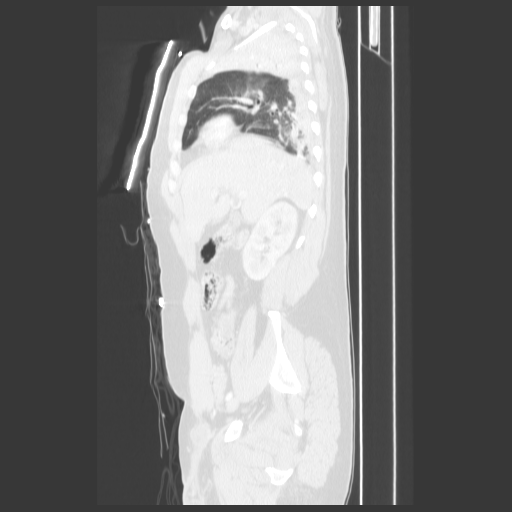
[im 94/187  soft-tissue]
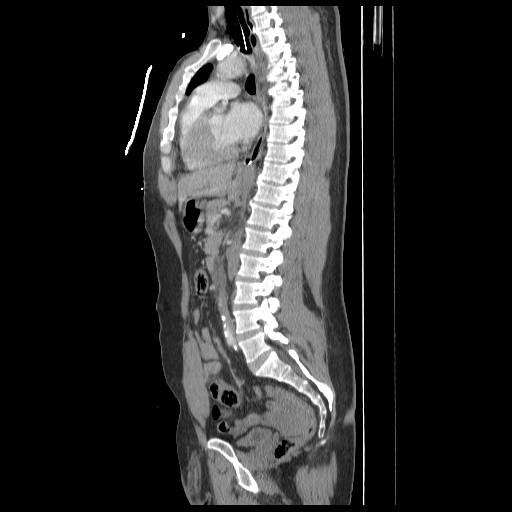
[im 94/187  lung]
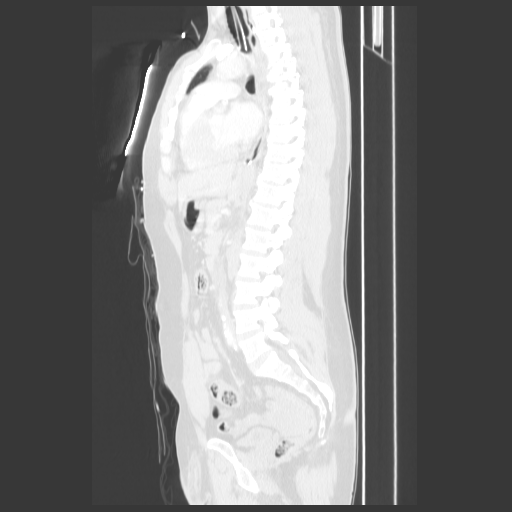
[im 125/187  soft-tissue]
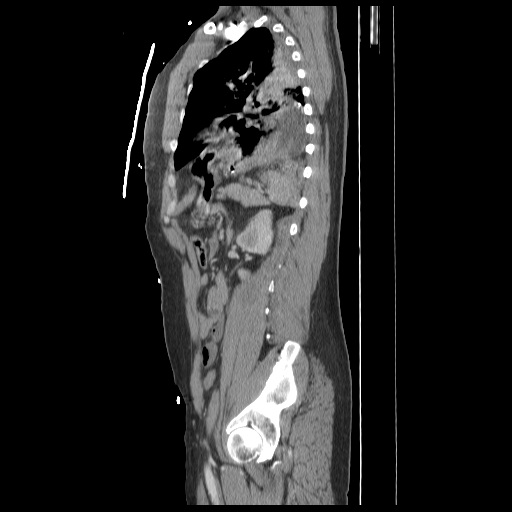
[im 125/187  lung]
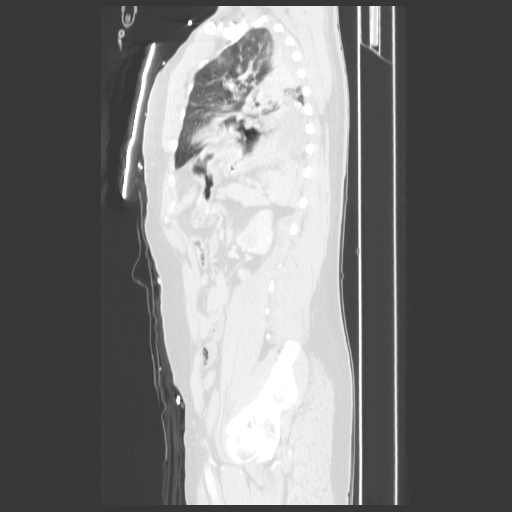
[im 156/187  soft-tissue]
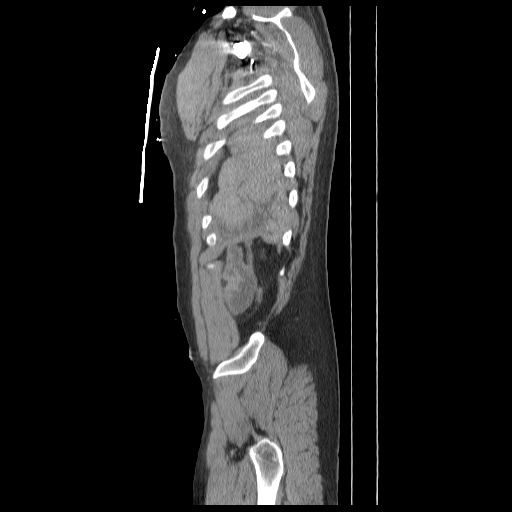

[Series 106: reformatted · coronal · 1.30mm/px · 3 of 138 slices shown (3 of 3)]
[im 35/138  soft-tissue]
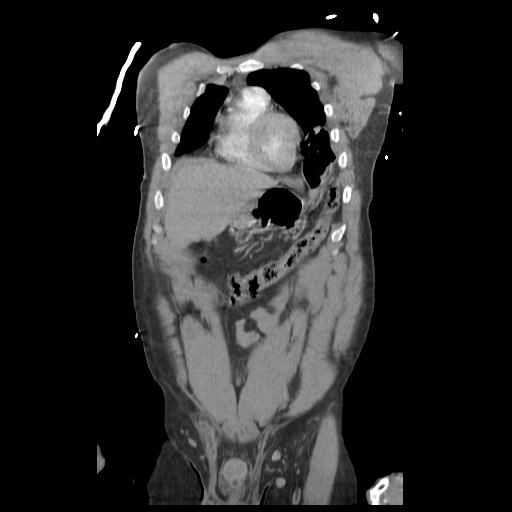
[im 69/138  soft-tissue]
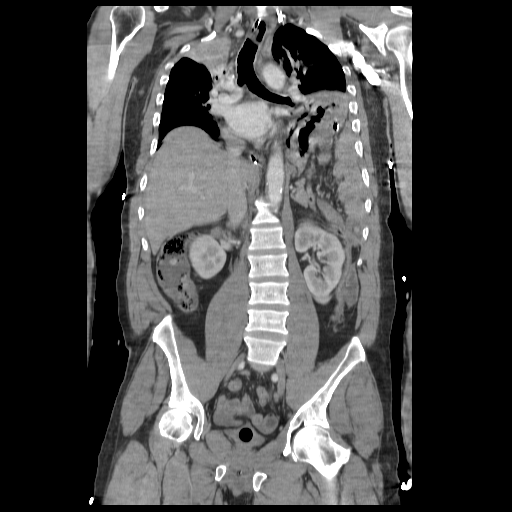
[im 103/138  soft-tissue]
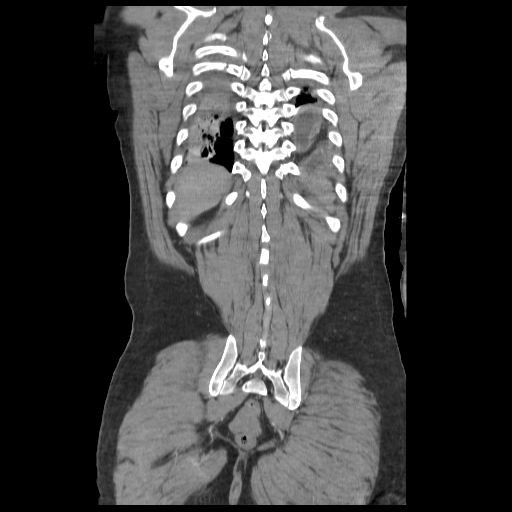

[13 of 32 positions shown; findings below may reference images not displayed]

FINDINGS: The heart size is normal.  Atherosclerotic
calcifications are noted at the aorta.  No significant pericardial
effusion is present.  The patient is intubated.  An NG tube is in
place.  Right upper lobe collapse is noted.  Dependent atelectasis
versus contusion is seen in the right lower lobe.  The left
hemidiaphragm is elevated with the stomach extending superiorly to
the level of the carina.  This may represent a diaphragmatic
rupture.  Airspace disease is present at the left lung base,
representing atelectasis or aspiration.  This could represent
contusion as well.

Minimally-displaced right second, fifth, and sixth rib fractures
are noted.  There is no pneumothorax.  A minimally-displaced left
twelfth rib fracture is noted. A comminuted fracture is present at
the fourth vertebral body without significant canal compromise.
IMPRESSION: 1.  Right second, fifth, and sixth rib fractures.
2.  Left twelfth rib fracture.
3.  Comminuted T4 vertebral body fracture without significant canal
compromise.
4.  Probable left diaphragmatic rupture with extension of abdominal
contents into the left hemithorax.
5.  Collapse of the right upper lobe.
6.  Contusion versus atelectasis posteriorly in the right lung.
7.  Left lower lobe airspace disease representing contusion,
atelectasis, or aspiration.

CT ABDOMEN AND PELVIS
FINDINGS: The liver and spleen are within normal limits.  As
stated above, the stomach and portions of bowel extend into the
left hemithorax, likely due to a ruptured hemidiaphragm.  The
remainder the stomach is unremarkable.  The duodenum and pancreas
are within normal limits.  The common bile duct and gallbladder are
normal.  The adrenal glands are normal bilaterally.  A 10 mm cyst
is present posteriorly in the right kidney.  The kidneys are
otherwise unremarkable.  Atherosclerotic calcifications are noted
in the abdominal aorta without aneurysm.

Hyperdense fluid is layering within the anatomic pelvis.  The
rectosigmoid colon is mostly collapsed.  The remainder of the colon
is unremarkable.  The appendix is visualized and within normal
limits.  The small bowel is unremarkable.  As stated above, there
are some loops in the left hemithorax.  A Foley catheter is present
within the urinary bladder.

Minimally-displaced fractures are present at the spinous process
bilaterally of L1 and L2.  Minimally-displaced fractures are
present in the right superior and inferior pubic ramus.  No other
fractures are seen.
IMPRESSION: 1.  Minimally-displaced fractures of the L1 and  L2 vertebral
bodies.
2.  Minimally-displaced fractures of the right superior and
inferior pubic ramus.
3.  Hemoperitoneum.
4.  Atherosclerosis.

## 2012-03-26 ENCOUNTER — Emergency Department (HOSPITAL_COMMUNITY): Payer: Medicare Other

## 2012-03-26 ENCOUNTER — Emergency Department (HOSPITAL_COMMUNITY)
Admission: EM | Admit: 2012-03-26 | Discharge: 2012-03-26 | Disposition: A | Discharge status: Home / Self Care | Payer: Medicare Other | Attending: Emergency Medicine | Admitting: Emergency Medicine

## 2012-03-26 ENCOUNTER — Encounter (HOSPITAL_COMMUNITY): Payer: Self-pay

## 2012-03-26 DIAGNOSIS — S42009A Fracture of unspecified part of unspecified clavicle, initial encounter for closed fracture: Secondary | ICD-10-CM | POA: Insufficient documentation

## 2012-03-26 DIAGNOSIS — I10 Essential (primary) hypertension: Secondary | ICD-10-CM | POA: Insufficient documentation

## 2012-03-26 DIAGNOSIS — Y92009 Unspecified place in unspecified non-institutional (private) residence as the place of occurrence of the external cause: Secondary | ICD-10-CM | POA: Insufficient documentation

## 2012-03-26 DIAGNOSIS — W108XXA Fall (on) (from) other stairs and steps, initial encounter: Secondary | ICD-10-CM | POA: Insufficient documentation

## 2012-03-26 DIAGNOSIS — Z8673 Personal history of transient ischemic attack (TIA), and cerebral infarction without residual deficits: Secondary | ICD-10-CM | POA: Insufficient documentation

## 2012-03-26 DIAGNOSIS — F17200 Nicotine dependence, unspecified, uncomplicated: Secondary | ICD-10-CM | POA: Insufficient documentation

## 2012-03-26 MED ORDER — OXYCODONE-ACETAMINOPHEN 5-325 MG PO TABS
2.0000 | ORAL_TABLET | Freq: Once | ORAL | Status: AC
  Administered 2012-03-26: 2 via ORAL
  Filled 2012-03-26: qty 2

## 2012-03-26 MED ORDER — OXYCODONE-ACETAMINOPHEN 5-325 MG PO TABS: 1.0000 | ORAL_TABLET | Freq: Four times a day (QID) | ORAL | Status: AC | PRN

## 2012-03-26 NOTE — Progress Notes (Signed)
Orthopedic Tech Progress Note Patient Details:  Phillip Chan 1961/04/16 [id number]  Ortho Devices Type of Ortho Device: Sling immobilizer Ortho Device/Splint Location: right arm Ortho Device/Splint Interventions: Application   Nikki Dom 03/26/2012, 5:49 PM

## 2012-03-26 NOTE — ED Notes (Signed)
Patient states that he fell going up the stairs injuring his right shoulder.  His left hand is swollen but he denies pain.  States that he fell Sunday night and was found Monday morning.  Bruising and swelling are noted over his right shoulder and clavicle. Patient C/O pain when he moves his shoulder.

## 2012-03-26 NOTE — ED Notes (Signed)
Pt reports (R) side "collar bone" pain x4 days, pt reports falling Sunday while walking up the steps, fell between stairs

## 2012-03-26 NOTE — ED Provider Notes (Signed)
History     CSN: [id number]  Arrival date & time 03/26/12  1255   First MD Initiated Contact with Patient 03/26/12 1710      Chief Complaint  Patient presents with  . Shoulder Pain    (Consider location/radiation/quality/duration/timing/severity/associated sxs/prior treatment) HPI Pt reports he stumbled and fell on the steps outside his house 3 days ago. He has history of stroke with weakness on R side. States he was unable to get up and so he was lying on the ground all night. Family found him the following morning (2 days ago) and helped him into the house. He has had moderate to severe R shoulder pain, worse with movement. He has mild L knee pain, able to walk with some discomfort. He denies any head injury or LOC. Has been up, eating and drinking around the house today.   Past Medical History  Diagnosis Date  . Hypertension     History reviewed. No pertinent past surgical history.  History reviewed. No pertinent family history.  History  Substance Use Topics  . Smoking status: Current Everyday Smoker  . Smokeless tobacco: Not on file  . Alcohol Use: No      Review of Systems All other systems reviewed and are negative except as noted in HPI.   Allergies  Review of patient's allergies indicates no known allergies.  Home Medications   Current Outpatient Rx  Name Route Sig Dispense Refill  . HYDROCHLOROTHIAZIDE 25 MG PO TABS Oral Take 25 mg by mouth daily.    . IBUPROFEN 200 MG PO TABS Oral Take 200 mg by mouth every 6 (six) hours as needed. For pain    . NIFEDIPINE ER 60 MG PO TB24 Oral Take 60 mg by mouth daily.      BP 163/97  Pulse 99  Temp 98.4 F (36.9 C) (Oral)  Resp 18  SpO2 99%  Physical Exam  Nursing note and vitals reviewed. Constitutional: He is oriented to person, place, and time. He appears well-developed and well-nourished.  HENT:  Head: Normocephalic and atraumatic.  Eyes: EOM are normal. Pupils are equal, round, and reactive to light.    Neck: Normal range of motion. Neck supple.  Cardiovascular: Normal rate, normal heart sounds and intact distal pulses.   Pulmonary/Chest: Effort normal and breath sounds normal.  Abdominal: Bowel sounds are normal. He exhibits no distension. There is no tenderness.  Musculoskeletal: He exhibits tenderness. He exhibits no edema.       Legs:      R clavicle is swollen and tender, neurovascularly intact  Neurological: He is alert and oriented to person, place, and time. No cranial nerve deficit or sensory deficit.       RUE weaker compared to L at baseline  Skin: Skin is warm and dry. No rash noted.  Psychiatric: He has a normal mood and affect.    ED Course  Procedures (including critical care time)  Labs Reviewed - No data to display Dg Shoulder Right  03/26/2012  *RADIOLOGY REPORT*  Clinical Data: Fall, shoulder pain  RIGHT SHOULDER - 2+ VIEW  Comparison: None.  Findings: Three views of the right shoulder submitted.  There is displaced fracture of the right clavicle.  The glenohumeral joint is preserved.  No shoulder dislocation.  IMPRESSION: Displaced fracture right clavicle.  Original Report Authenticated By: Natasha Mead, M.D.     No diagnosis found.    MDM  Clavicle injury as above. Sling, pain medications. Pt states does not need imaging of  knee, has abrasion but no concern for fracture. He has been back to his baseline in terms of eating and drinking over the last 48hrs despite spending a night on the ground. No need for further evaluation of rhabdo or dehydration.        Linde Wilensky B. Bernette Mayers, MD 03/26/12 1740

## 2014-03-16 ENCOUNTER — Encounter (HOSPITAL_COMMUNITY): Payer: Self-pay | Admitting: Emergency Medicine

## 2014-03-16 ENCOUNTER — Emergency Department (HOSPITAL_COMMUNITY)
Admission: EM | Admit: 2014-03-16 | Discharge: 2014-03-16 | Disposition: A | Discharge status: Home / Self Care | Payer: Medicare Other | Attending: Emergency Medicine | Admitting: Emergency Medicine

## 2014-03-16 DIAGNOSIS — R1031 Right lower quadrant pain: Secondary | ICD-10-CM | POA: Diagnosis present

## 2014-03-16 DIAGNOSIS — Z79899 Other long term (current) drug therapy: Secondary | ICD-10-CM | POA: Insufficient documentation

## 2014-03-16 DIAGNOSIS — IMO0001 Reserved for inherently not codable concepts without codable children: Secondary | ICD-10-CM | POA: Insufficient documentation

## 2014-03-16 DIAGNOSIS — I10 Essential (primary) hypertension: Secondary | ICD-10-CM | POA: Diagnosis not present

## 2014-03-16 DIAGNOSIS — E860 Dehydration: Secondary | ICD-10-CM | POA: Diagnosis not present

## 2014-03-16 DIAGNOSIS — F17200 Nicotine dependence, unspecified, uncomplicated: Secondary | ICD-10-CM | POA: Insufficient documentation

## 2014-03-16 LAB — CBC
HCT: 51.5 % (ref 39.0–52.0)
Hemoglobin: 17.6 g/dL — ABNORMAL HIGH (ref 13.0–17.0)
MCH: 34.6 pg — ABNORMAL HIGH (ref 26.0–34.0)
MCHC: 34.2 g/dL (ref 30.0–36.0)
MCV: 101.2 fL — ABNORMAL HIGH (ref 78.0–100.0)
PLATELETS: 244 10*3/uL (ref 150–400)
RBC: 5.09 MIL/uL (ref 4.22–5.81)
RDW: 13.1 % (ref 11.5–15.5)
WBC: 3.9 10*3/uL — AB (ref 4.0–10.5)

## 2014-03-16 LAB — URINALYSIS, ROUTINE W REFLEX MICROSCOPIC
Glucose, UA: NEGATIVE mg/dL
Hgb urine dipstick: NEGATIVE
Ketones, ur: NEGATIVE mg/dL
NITRITE: NEGATIVE
PH: 5.5 (ref 5.0–8.0)
Protein, ur: NEGATIVE mg/dL
SPECIFIC GRAVITY, URINE: 1.028 (ref 1.005–1.030)
UROBILINOGEN UA: 1 mg/dL (ref 0.0–1.0)

## 2014-03-16 LAB — BASIC METABOLIC PANEL
ANION GAP: 14 (ref 5–15)
BUN: 22 mg/dL (ref 6–23)
CHLORIDE: 99 meq/L (ref 96–112)
CO2: 26 meq/L (ref 19–32)
Calcium: 9.4 mg/dL (ref 8.4–10.5)
Creatinine, Ser: 1.13 mg/dL (ref 0.50–1.35)
GFR calc non Af Amer: 73 mL/min — ABNORMAL LOW (ref >90)
GFR, EST AFRICAN AMERICAN: 85 mL/min — AB (ref >90)
Glucose, Bld: 73 mg/dL (ref 70–99)
POTASSIUM: 4.3 meq/L (ref 3.7–5.3)
SODIUM: 139 meq/L (ref 137–147)

## 2014-03-16 LAB — URINE MICROSCOPIC-ADD ON

## 2014-03-16 LAB — CK: Total CK: 371 U/L — ABNORMAL HIGH (ref 7–232)

## 2014-03-16 NOTE — ED Provider Notes (Signed)
CSN: [id number]     Arrival date & time 03/16/14  1528 History   First MD Initiated Contact with Patient 03/16/14 2020     Chief Complaint  Patient presents with  . Abdominal Pain    cramping all over     (Consider location/radiation/quality/duration/timing/severity/associated sxs/prior Treatment) HPI Comments: Patient presents to the emergency department with chief complaint of muscle cramps. He states that he has had cramps in his legs and in his arms and his back and neck for the past couple of days. He states that he has been working outside in the heat. He does not drink very much water. He states he drinks a lot of soda. He denies any pain or cramping now. Additionally, he reports an abdominal hernia, but states that this does not cause him any problem. He denies any fevers chills. Denies any chest pain or shortness of breath. Denies any nausea or vomiting.  The history is provided by the patient. No language interpreter was used.    Past Medical History  Diagnosis Date  . Hypertension    Past Surgical History  Procedure Laterality Date  . Brain surgery    . Abdominal surgery    . Neck surgery     No family history on file. History  Substance Use Topics  . Smoking status: Current Every Day Smoker  . Smokeless tobacco: Not on file  . Alcohol Use: No    Review of Systems  All other systems reviewed and are negative.     Allergies  Review of patient's allergies indicates no known allergies.  Home Medications   Prior to Admission medications   Medication Sig Start Date End Date Taking? Authorizing Provider  hydrochlorothiazide (HYDRODIURIL) 25 MG tablet Take 25 mg by mouth daily.    Historical Provider, MD  ibuprofen (ADVIL,MOTRIN) 200 MG tablet Take 200 mg by mouth every 6 (six) hours as needed. For pain    Historical Provider, MD  NIFEdipine (PROCARDIA-XL/ADALAT CC) 60 MG 24 hr tablet Take 60 mg by mouth daily.    Historical Provider, MD   BP 155/91  Pulse 93   Temp(Src) 97.9 F (36.6 C) (Oral)  Resp 18  SpO2 96% Physical Exam  Nursing note and vitals reviewed. Constitutional: He is oriented to person, place, and time. He appears well-developed and well-nourished.  HENT:  Head: Normocephalic and atraumatic.  Eyes: Conjunctivae and EOM are normal. Pupils are equal, round, and reactive to light. Right eye exhibits no discharge. Left eye exhibits no discharge. No scleral icterus.  Neck: Normal range of motion. Neck supple. No JVD present.  Cardiovascular: Normal rate, regular rhythm and normal heart sounds.  Exam reveals no gallop and no friction rub.   No murmur heard. Pulmonary/Chest: Effort normal and breath sounds normal. No respiratory distress. He has no wheezes. He has no rales. He exhibits no tenderness.  Abdominal: Soft. He exhibits no distension and no mass. There is no tenderness. There is no rebound and no guarding.  Musculoskeletal: Normal range of motion. He exhibits no edema and no tenderness.  Neurological: He is alert and oriented to person, place, and time.  Skin: Skin is warm and dry.  Psychiatric: He has a normal mood and affect. His behavior is normal. Judgment and thought content normal.    ED Course  Procedures (including critical care time) Results for orders placed during the hospital encounter of 03/16/14  CBC      Result Value Ref Range   WBC 3.9 (*) 4.0 -  10.5 K/uL   RBC 5.09  4.22 - 5.81 MIL/uL   Hemoglobin 17.6 (*) 13.0 - 17.0 g/dL   HCT 16.151.5  09.639.0 - 04.552.0 %   MCV 101.2 (*) 78.0 - 100.0 fL   MCH 34.6 (*) 26.0 - 34.0 pg   MCHC 34.2  30.0 - 36.0 g/dL   RDW 40.913.1  81.111.5 - 91.415.5 %   Platelets 244  150 - 400 K/uL  BASIC METABOLIC PANEL      Result Value Ref Range   Sodium 139  137 - 147 mEq/L   Potassium 4.3  3.7 - 5.3 mEq/L   Chloride 99  96 - 112 mEq/L   CO2 26  19 - 32 mEq/L   Glucose, Bld 73  70 - 99 mg/dL   BUN 22  6 - 23 mg/dL   Creatinine, Ser 7.821.13  0.50 - 1.35 mg/dL   Calcium 9.4  8.4 - 95.610.5 mg/dL   GFR  calc non Af Amer 73 (*) >90 mL/min   GFR calc Af Amer 85 (*) >90 mL/min   Anion gap 14  5 - 15  URINALYSIS, ROUTINE W REFLEX MICROSCOPIC      Result Value Ref Range   Color, Urine AMBER (*) YELLOW   APPearance CLEAR  CLEAR   Specific Gravity, Urine 1.028  1.005 - 1.030   pH 5.5  5.0 - 8.0   Glucose, UA NEGATIVE  NEGATIVE mg/dL   Hgb urine dipstick NEGATIVE  NEGATIVE   Bilirubin Urine SMALL (*) NEGATIVE   Ketones, ur NEGATIVE  NEGATIVE mg/dL   Protein, ur NEGATIVE  NEGATIVE mg/dL   Urobilinogen, UA 1.0  0.0 - 1.0 mg/dL   Nitrite NEGATIVE  NEGATIVE   Leukocytes, UA SMALL (*) NEGATIVE  URINE MICROSCOPIC-ADD ON      Result Value Ref Range   Squamous Epithelial / LPF FEW (*) RARE   WBC, UA 3-6  <3 WBC/hpf   Bacteria, UA FEW (*) RARE   Urine-Other MUCOUS PRESENT    CK      Result Value Ref Range   Total CK 371 (*) 7 - 232 U/L   No results found.   Imaging Review No results found.   EKG Interpretation None     ED ECG REPORT  I personally interpreted this EKG   Date: 03/16/2014   Rate: 94  Rhythm: normal sinus rhythm  QRS Axis: normal  Intervals: normal  ST/T Wave abnormalities: nonspecific ST changes  Conduction Disutrbances:PACs  Narrative Interpretation:   Old EKG Reviewed: none available    MDM   Final diagnoses:  Dehydration   Patient with muscle cramps. Labs are unremarkable. Recommend increasing fluids.  Patient with symptoms of dehydration including muscle cramps. No evidence of rhabdomyolysis. No chest pain or shortness of breath. Electrolytes appear stable. Will discharge to home with instructions to drink more water. Recommend primary care followup. Patient understands and agrees to plan. He is stable and ready for discharge.    Roxy Horsemanobert Dyanara Cozza, PA-C 03/16/14 2131

## 2014-03-16 NOTE — ED Notes (Signed)
Pt reports with coughing the right lower abdominal area has a bulge and then goes away-not bothering him now.  Reports cramps to neck, arms, legs, toes, and abdomen.  Pt reports got dizzy and almost passed out the other day.  No pain now

## 2014-03-16 NOTE — Discharge Instructions (Signed)
You need to schedule a follow-up appointment with your regular doctor in the next couple of days.  You will need to have your CK, potassium, and sodium rechecked.   Dehydration, Adult Dehydration is when you lose more fluids from the body than you take in. Vital organs like the kidneys, brain, and heart cannot function without a proper amount of fluids and salt. Any loss of fluids from the body can cause dehydration.  CAUSES   Vomiting.  Diarrhea.  Excessive sweating.  Excessive urine output.  Fever. SYMPTOMS  Mild dehydration  Thirst.  Dry lips.  Slightly dry mouth. Moderate dehydration  Very dry mouth.  Sunken eyes.  Skin does not bounce back quickly when lightly pinched and released.  Dark urine and decreased urine production.  Decreased tear production.  Headache. Severe dehydration  Very dry mouth.  Extreme thirst.  Rapid, weak pulse (more than 100 beats per minute at rest).  Cold hands and feet.  Not able to sweat in spite of heat and temperature.  Rapid breathing.  Blue lips.  Confusion and lethargy.  Difficulty being awakened.  Minimal urine production.  No tears. DIAGNOSIS  Your caregiver will diagnose dehydration based on your symptoms and your exam. Blood and urine tests will help confirm the diagnosis. The diagnostic evaluation should also identify the cause of dehydration. TREATMENT  Treatment of mild or moderate dehydration can often be done at home by increasing the amount of fluids that you drink. It is best to drink small amounts of fluid more often. Drinking too much at one time can make vomiting worse. Refer to the home care instructions below. Severe dehydration needs to be treated at the hospital where you will probably be given intravenous (IV) fluids that contain water and electrolytes. HOME CARE INSTRUCTIONS   Ask your caregiver about specific rehydration instructions.  Drink enough fluids to keep your urine clear or pale  yellow.  Drink small amounts frequently if you have nausea and vomiting.  Eat as you normally do.  Avoid:  Foods or drinks high in sugar.  Carbonated drinks.  Juice.  Extremely hot or cold fluids.  Drinks with caffeine.  Fatty, greasy foods.  Alcohol.  Tobacco.  Overeating.  Gelatin desserts.  Wash your hands well to avoid spreading bacteria and viruses.  Only take over-the-counter or prescription medicines for pain, discomfort, or fever as directed by your caregiver.  Ask your caregiver if you should continue all prescribed and over-the-counter medicines.  Keep all follow-up appointments with your caregiver. SEEK MEDICAL CARE IF:  You have abdominal pain and it increases or stays in one area (localizes).  You have a rash, stiff neck, or severe headache.  You are irritable, sleepy, or difficult to awaken.  You are weak, dizzy, or extremely thirsty. SEEK IMMEDIATE MEDICAL CARE IF:   You are unable to keep fluids down or you get worse despite treatment.  You have frequent episodes of vomiting or diarrhea.  You have blood or green matter (bile) in your vomit.  You have blood in your stool or your stool looks black and tarry.  You have not urinated in 6 to 8 hours, or you have only urinated a small amount of very dark urine.  You have a fever.  You faint. MAKE SURE YOU:   Understand these instructions.  Will watch your condition.  Will get help right away if you are not doing well or get worse. Document Released: 07/30/2005 Document Revised: 10/22/2011 Document Reviewed: 03/19/2011 [employer] Patient Information  2015 [employer]. This information is not intended to replace advice given to you by your health care provider. Make sure you discuss any questions you have with your health care provider. ° °

## 2014-03-17 NOTE — ED Provider Notes (Signed)
Medical screening examination/treatment/procedure(s) were performed by non-physician practitioner and as supervising physician I was immediately available for consultation/collaboration.   EKG Interpretation None       Princess Karnes M Vayla Wilhelmi, MD 03/17/14 0106 

## 2015-02-20 ENCOUNTER — Encounter (HOSPITAL_COMMUNITY): Payer: Self-pay | Admitting: Emergency Medicine

## 2015-02-20 ENCOUNTER — Emergency Department (HOSPITAL_COMMUNITY): Payer: Medicare Other

## 2015-02-20 ENCOUNTER — Emergency Department (HOSPITAL_COMMUNITY)
Admission: EM | Admit: 2015-02-20 | Discharge: 2015-02-20 | Disposition: A | Discharge status: Home / Self Care | Payer: Medicare Other | Attending: Emergency Medicine | Admitting: Emergency Medicine

## 2015-02-20 DIAGNOSIS — Z720 Tobacco use: Secondary | ICD-10-CM | POA: Insufficient documentation

## 2015-02-20 DIAGNOSIS — I10 Essential (primary) hypertension: Secondary | ICD-10-CM | POA: Diagnosis not present

## 2015-02-20 DIAGNOSIS — R0789 Other chest pain: Secondary | ICD-10-CM | POA: Diagnosis not present

## 2015-02-20 DIAGNOSIS — R12 Heartburn: Secondary | ICD-10-CM | POA: Diagnosis not present

## 2015-02-20 DIAGNOSIS — R079 Chest pain, unspecified: Secondary | ICD-10-CM | POA: Diagnosis present

## 2015-02-20 LAB — BASIC METABOLIC PANEL
ANION GAP: 9 (ref 5–15)
BUN: 6 mg/dL (ref 6–20)
CHLORIDE: 100 mmol/L — AB (ref 101–111)
CO2: 28 mmol/L (ref 22–32)
Calcium: 9 mg/dL (ref 8.9–10.3)
Creatinine, Ser: 0.94 mg/dL (ref 0.61–1.24)
GFR calc non Af Amer: >60 mL/min (ref >60)
Glucose, Bld: 106 mg/dL — ABNORMAL HIGH (ref 65–99)
Potassium: 3.8 mmol/L (ref 3.5–5.1)
Sodium: 137 mmol/L (ref 135–145)

## 2015-02-20 LAB — CBC
HEMATOCRIT: 43.2 % (ref 39.0–52.0)
HEMOGLOBIN: 14.9 g/dL (ref 13.0–17.0)
MCH: 33.8 pg (ref 26.0–34.0)
MCHC: 34.5 g/dL (ref 30.0–36.0)
MCV: 98 fL (ref 78.0–100.0)
Platelets: 209 10*3/uL (ref 150–400)
RBC: 4.41 MIL/uL (ref 4.22–5.81)
RDW: 13.2 % (ref 11.5–15.5)
WBC: 6 10*3/uL (ref 4.0–10.5)

## 2015-02-20 LAB — I-STAT TROPONIN, ED: Troponin i, poc: 0 ng/mL (ref 0.00–0.08)

## 2015-02-20 MED ORDER — NITROGLYCERIN 0.4 MG SL SUBL
0.4000 mg | SUBLINGUAL_TABLET | SUBLINGUAL | Status: DC | PRN
  Administered 2015-02-20 (×2): 0.4 mg via SUBLINGUAL
  Filled 2015-02-20: qty 1

## 2015-02-20 MED ORDER — GI COCKTAIL ~~LOC~~
30.0000 mL | Freq: Once | ORAL | Status: AC
  Administered 2015-02-20: 30 mL via ORAL
  Filled 2015-02-20: qty 30

## 2015-02-20 MED ORDER — NIFEDIPINE ER 60 MG PO TB24: 60.0000 mg | ORAL_TABLET | Freq: Every day | ORAL | Status: AC

## 2015-02-20 MED ORDER — HYDROCHLOROTHIAZIDE 25 MG PO TABS: 25.0000 mg | ORAL_TABLET | Freq: Every day | ORAL | Status: AC

## 2015-02-20 MED ORDER — ASPIRIN 325 MG PO TABS
325.0000 mg | ORAL_TABLET | ORAL | Status: AC
  Administered 2015-02-20: 325 mg via ORAL
  Filled 2015-02-20: qty 1

## 2015-02-20 NOTE — Discharge Instructions (Signed)

## 2015-02-20 NOTE — ED Provider Notes (Signed)
CSN: [id number]     Arrival date & time 02/20/15  1191 History   First MD Initiated Contact with Patient 02/20/15 [phone]     Chief Complaint  Patient presents with  . Chest Pain     (Consider location/radiation/quality/duration/timing/severity/associated sxs/prior Treatment) Patient is a 54 y.o. male presenting with chest pain.  Chest Pain Pain location:  Substernal area and L chest Pain quality: burning   Pain radiates to:  Does not radiate Pain radiates to the back: no   Pain severity:  Moderate Onset quality:  Gradual Duration:  1 week Context: at rest   Relieved by:  Nothing Worsened by:  Deep breathing and certain positions Associated symptoms: heartburn   Associated symptoms: no diaphoresis, no lower extremity edema, no nausea, no shortness of breath and not vomiting     Past Medical History  Diagnosis Date  . Hypertension    Past Surgical History  Procedure Laterality Date  . Brain surgery    . Abdominal surgery    . Neck surgery     No family history on file. History  Substance Use Topics  . Smoking status: Current Every Day Smoker -- 0.50 packs/day    Types: Cigarettes  . Smokeless tobacco: Not on file  . Alcohol Use: No    Review of Systems  Constitutional: Negative for diaphoresis.  Respiratory: Negative for shortness of breath.   Cardiovascular: Positive for chest pain.  Gastrointestinal: Positive for heartburn. Negative for nausea and vomiting.  All other systems reviewed and are negative.     Allergies  Review of patient's allergies indicates no known allergies.  Home Medications   Prior to Admission medications   Medication Sig Start Date End Date Taking? Authorizing Provider  hydrochlorothiazide (HYDRODIURIL) 25 MG tablet Take 1 tablet (25 mg total) by mouth daily. 02/20/15   Mirian Mo, MD  ibuprofen (ADVIL,MOTRIN) 200 MG tablet Take 200 mg by mouth every 6 (six) hours as needed. For pain    Historical Provider, MD  NIFEdipine  (PROCARDIA-XL/ADALAT CC) 60 MG 24 hr tablet Take 1 tablet (60 mg total) by mouth daily. 02/20/15   Mirian Mo, MD   BP 170/103 mmHg  Pulse 62  Temp(Src) 98.1 F (36.7 C) (Oral)  Resp 16  Ht  (1.778 m)  Wt 210 lb (95.255 kg)  BMI 30.13 kg/m2  SpO2 98% Physical Exam  Constitutional: He is oriented to person, place, and time. He appears well-developed and well-nourished.  HENT:  Head: Normocephalic and atraumatic.  Eyes: Conjunctivae and EOM are normal.  Neck: Normal range of motion. Neck supple.  Cardiovascular: Normal rate, regular rhythm and normal heart sounds.   Pulmonary/Chest: Effort normal and breath sounds normal. No respiratory distress. He exhibits tenderness.  Abdominal: He exhibits no distension. There is no tenderness. There is no rebound and no guarding.  Musculoskeletal: Normal range of motion.  Neurological: He is alert and oriented to person, place, and time.  Skin: Skin is warm and dry.  Vitals reviewed.   ED Course  Procedures (including critical care time) Labs Review Labs Reviewed  BASIC METABOLIC PANEL - Abnormal; Notable for the following:    Chloride 100 (*)    Glucose, Bld 106 (*)    All other components within normal limits  CBC  I-STAT TROPOININ, ED    Imaging Review Dg Chest Port 1 View  02/20/2015   CLINICAL DATA:  Right-sided chest pain for 2 days.  EXAM: PORTABLE CHEST - 1 VIEW  COMPARISON:  Right shoulder  radiograph on 03/26/2012  FINDINGS: The heart size and mediastinal contours are within normal limits. Both lungs are clear. Old right clavicle fracture deformity again noted.  IMPRESSION: No acute findings.   Electronically Signed   By: Myles RosenthalJohn  Stahl M.D.   On: 02/20/2015 07:38     EKG Interpretation   Date/Time:  Sunday February 20 2015 06:45:58 EDT Ventricular Rate:  72 PR Interval:  130 QRS Duration: 83 QT Interval:  432 QTC Calculation: 473 R Axis:   11 Text Interpretation:  Sinus rhythm RSR' in V1 or V2, probably normal   variant Borderline T wave abnormalities No significant change since last  tracing Confirmed by Mirian MoGentry, Matthew [phone]([id number]) on 02/20/2015 6:55:43 AM      MDM   Final diagnoses:  Chest wall pain    54 y.o. male with pertinent PMH of HTN presents with chest pain as above, atypical vs MSK.  No systemic symptoms otherwise.  Physical exam as above. Pain reproducible on palpation of chest.  Dorna BloomWu with negative, no new symptoms in 6 hours.  Will refill meds for short term. Encouraged close PCP fu.  I have reviewed all laboratory and imaging studies if ordered as above  1. Chest wall pain         Mirian MoMatthew Gentry, MD 02/21/15 [phone]

## 2015-02-20 NOTE — ED Notes (Signed)
Pt d/c'd from IV, monitor, continuous pulse oximetry and blood pressure cuff; pt getting dressed to be discharged home 
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
## 2015-02-20 NOTE — ED Notes (Signed)
Resident MD at bedside.

## 2016-03-03 ENCOUNTER — Emergency Department (HOSPITAL_COMMUNITY): Payer: Medicare Other

## 2016-03-03 ENCOUNTER — Encounter (HOSPITAL_COMMUNITY): Payer: Self-pay | Admitting: Emergency Medicine

## 2016-03-03 ENCOUNTER — Emergency Department (HOSPITAL_COMMUNITY)
Admission: EM | Admit: 2016-03-03 | Discharge: 2016-03-03 | Disposition: A | Discharge status: Home / Self Care | Payer: Medicare Other | Attending: Physician Assistant | Admitting: Physician Assistant

## 2016-03-03 DIAGNOSIS — S5012XA Contusion of left forearm, initial encounter: Secondary | ICD-10-CM | POA: Diagnosis not present

## 2016-03-03 DIAGNOSIS — S161XXA Strain of muscle, fascia and tendon at neck level, initial encounter: Secondary | ICD-10-CM | POA: Insufficient documentation

## 2016-03-03 DIAGNOSIS — Z79899 Other long term (current) drug therapy: Secondary | ICD-10-CM | POA: Insufficient documentation

## 2016-03-03 DIAGNOSIS — S39012A Strain of muscle, fascia and tendon of lower back, initial encounter: Secondary | ICD-10-CM | POA: Insufficient documentation

## 2016-03-03 DIAGNOSIS — I10 Essential (primary) hypertension: Secondary | ICD-10-CM | POA: Insufficient documentation

## 2016-03-03 DIAGNOSIS — Y999 Unspecified external cause status: Secondary | ICD-10-CM | POA: Diagnosis not present

## 2016-03-03 DIAGNOSIS — Y939 Activity, unspecified: Secondary | ICD-10-CM | POA: Insufficient documentation

## 2016-03-03 DIAGNOSIS — Y92410 Unspecified street and highway as the place of occurrence of the external cause: Secondary | ICD-10-CM | POA: Diagnosis not present

## 2016-03-03 DIAGNOSIS — S199XXA Unspecified injury of neck, initial encounter: Secondary | ICD-10-CM | POA: Diagnosis present

## 2016-03-03 DIAGNOSIS — F17210 Nicotine dependence, cigarettes, uncomplicated: Secondary | ICD-10-CM | POA: Insufficient documentation

## 2016-03-03 MED ORDER — METHOCARBAMOL 500 MG PO TABS: 500.0000 mg | ORAL_TABLET | Freq: Two times a day (BID) | ORAL | Status: AC

## 2016-03-03 MED ORDER — IBUPROFEN 600 MG PO TABS: 600.0000 mg | ORAL_TABLET | Freq: Four times a day (QID) | ORAL | Status: AC | PRN

## 2016-03-03 MED ORDER — IBUPROFEN 200 MG PO TABS
600.0000 mg | ORAL_TABLET | Freq: Once | ORAL | Status: AC
  Administered 2016-03-03: 600 mg via ORAL
  Filled 2016-03-03: qty 1
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
## 2016-03-03 NOTE — ED Notes (Signed)
Resting quietly on stretcher w/eyes closed. Respirations even, unlabored. Pt woke to voice.
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
## 2016-03-03 NOTE — ED Notes (Signed)
Returned from xray

## 2016-03-03 NOTE — ED Notes (Signed)
Patient returned from X-ray 

## 2016-03-03 NOTE — ED Notes (Signed)
Pt to xray
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
## 2017-02-26 ENCOUNTER — Encounter (HOSPITAL_COMMUNITY): Payer: Self-pay | Admitting: Emergency Medicine

## 2017-02-26 ENCOUNTER — Emergency Department (HOSPITAL_COMMUNITY)
Admission: EM | Admit: 2017-02-26 | Discharge: 2017-02-27 | Disposition: A | Discharge status: Home / Self Care | Payer: Medicare Other | Attending: Emergency Medicine | Admitting: Emergency Medicine

## 2017-02-26 ENCOUNTER — Emergency Department (HOSPITAL_COMMUNITY): Payer: Medicare Other

## 2017-02-26 DIAGNOSIS — Z79899 Other long term (current) drug therapy: Secondary | ICD-10-CM | POA: Diagnosis not present

## 2017-02-26 DIAGNOSIS — R109 Unspecified abdominal pain: Secondary | ICD-10-CM | POA: Diagnosis present

## 2017-02-26 DIAGNOSIS — I10 Essential (primary) hypertension: Secondary | ICD-10-CM | POA: Diagnosis not present

## 2017-02-26 DIAGNOSIS — K5900 Constipation, unspecified: Secondary | ICD-10-CM | POA: Diagnosis not present

## 2017-02-26 DIAGNOSIS — K2900 Acute gastritis without bleeding: Secondary | ICD-10-CM

## 2017-02-26 DIAGNOSIS — F17210 Nicotine dependence, cigarettes, uncomplicated: Secondary | ICD-10-CM | POA: Insufficient documentation

## 2017-02-26 HISTORY — DX: Cerebral infarction, unspecified: I63.9

## 2017-02-26 LAB — CBC
HCT: 40.3 % (ref 39.0–52.0)
HEMOGLOBIN: 13.5 g/dL (ref 13.0–17.0)
MCH: 32.6 pg (ref 26.0–34.0)
MCHC: 33.5 g/dL (ref 30.0–36.0)
MCV: 97.3 fL (ref 78.0–100.0)
Platelets: 269 10*3/uL (ref 150–400)
RBC: 4.14 MIL/uL — AB (ref 4.22–5.81)
RDW: 12.2 % (ref 11.5–15.5)
WBC: 4.8 10*3/uL (ref 4.0–10.5)

## 2017-02-26 LAB — LIPASE, BLOOD: LIPASE: 45 U/L (ref 11–51)

## 2017-02-26 LAB — URINALYSIS, ROUTINE W REFLEX MICROSCOPIC
BILIRUBIN URINE: NEGATIVE
GLUCOSE, UA: NEGATIVE mg/dL
HGB URINE DIPSTICK: NEGATIVE
KETONES UR: NEGATIVE mg/dL
NITRITE: NEGATIVE
PROTEIN: NEGATIVE mg/dL
Specific Gravity, Urine: 1.021 (ref 1.005–1.030)
Squamous Epithelial / LPF: NONE SEEN
pH: 8 (ref 5.0–8.0)

## 2017-02-26 LAB — COMPREHENSIVE METABOLIC PANEL
ALK PHOS: 82 U/L (ref 38–126)
ALT: 50 U/L (ref 17–63)
ANION GAP: 8 (ref 5–15)
AST: 46 U/L — ABNORMAL HIGH (ref 15–41)
Albumin: 3.8 g/dL (ref 3.5–5.0)
BUN: 14 mg/dL (ref 6–20)
CALCIUM: 8.8 mg/dL — AB (ref 8.9–10.3)
CO2: 28 mmol/L (ref 22–32)
Chloride: 98 mmol/L — ABNORMAL LOW (ref 101–111)
Creatinine, Ser: 1 mg/dL (ref 0.61–1.24)
GFR calc non Af Amer: >60 mL/min (ref >60)
Glucose, Bld: 102 mg/dL — ABNORMAL HIGH (ref 65–99)
Potassium: 3.3 mmol/L — ABNORMAL LOW (ref 3.5–5.1)
Sodium: 134 mmol/L — ABNORMAL LOW (ref 135–145)
Total Bilirubin: 0.5 mg/dL (ref 0.3–1.2)
Total Protein: 7.3 g/dL (ref 6.5–8.1)

## 2017-02-26 MED ORDER — IOPAMIDOL (ISOVUE-300) INJECTION 61%
INTRAVENOUS | Status: AC
  Administered 2017-02-26: 100 mL
  Filled 2017-02-26: qty 100

## 2017-02-26 MED ORDER — SODIUM CHLORIDE 0.9 % IV BOLUS (SEPSIS)
1000.0000 mL | Freq: Once | INTRAVENOUS | Status: AC
  Administered 2017-02-27: 1000 mL via INTRAVENOUS

## 2017-02-26 NOTE — ED Triage Notes (Signed)
Pt states he has been having abd pain for several days. He was constipated for a few days, took a laxative, and was able to have BM. But continues to have abd pain. Denies N/V.

## 2017-02-27 DIAGNOSIS — K2900 Acute gastritis without bleeding: Secondary | ICD-10-CM | POA: Diagnosis not present

## 2017-02-27 MED ORDER — POLYETHYLENE GLYCOL 3350 17 GM/SCOOP PO POWD: 17.0000 g | Freq: Two times a day (BID) | ORAL | 0 refills | Status: AC

## 2017-02-27 MED ORDER — GI COCKTAIL ~~LOC~~
30.0000 mL | Freq: Once | ORAL | Status: AC
  Administered 2017-02-27: 30 mL via ORAL
  Filled 2017-02-27: qty 30

## 2017-02-27 MED ORDER — ONDANSETRON 4 MG PO TBDP: 4.0000 mg | ORAL_TABLET | Freq: Three times a day (TID) | ORAL | 0 refills | Status: AC | PRN

## 2017-02-27 MED ORDER — MAGNESIUM CITRATE PO SOLN: 296.0000 mL | Freq: Once | ORAL | 0 refills | Status: AC

## 2017-02-27 NOTE — ED Notes (Signed)
The pt returned from c-t 

## 2017-02-27 NOTE — ED Provider Notes (Signed)
Hand-off from BoeingChris Lawyer, PA-C. Dispo pending CT abdomen/pelvis.   See initial provider's note for full HPI.  Briefly patient is a 56 year old male with history of hypertension and CVA who presents the ED with complaint of abdominal discomfort. Patient states he feels like he is constipated and notes he has not had a bowel movement since last Thursday. He states he has not been able to pass gas. Reports decreased by mouth intake since onset of symptoms. Denies any other associated symptoms.  Physical Exam  BP (!) 179/112   Pulse 79   Temp 98.1 F (36.7 C) (Oral)   Resp 18   Ht 5\' 7"  (1.702 m)   Wt 99.8 kg (220 lb)   SpO2 100%   BMI 34.46 kg/m   Physical Exam  Constitutional: He is oriented to person, place, and time. He appears well-developed and well-nourished. No distress.  HENT:  Head: Normocephalic and atraumatic.  Mouth/Throat: Uvula is midline, oropharynx is clear and moist and mucous membranes are normal. No oropharyngeal exudate, posterior oropharyngeal edema, posterior oropharyngeal erythema or tonsillar abscesses. No tonsillar exudate.  Eyes: Conjunctivae and EOM are normal. Right eye exhibits no discharge. Left eye exhibits no discharge. No scleral icterus.  Neck: Normal range of motion. Neck supple.  Cardiovascular: Normal rate, regular rhythm, normal heart sounds and intact distal pulses.   Pulmonary/Chest: Effort normal and breath sounds normal. No respiratory distress. He has no wheezes. He has no rales. He exhibits no tenderness.  Abdominal: Soft. Bowel sounds are normal. He exhibits no distension and no mass. There is tenderness. There is no rigidity, no rebound, no guarding and no CVA tenderness. No hernia.  Mild intermittent diffuse abdominal tenderness  Musculoskeletal: Normal range of motion. He exhibits no edema.  Neurological: He is alert and oriented to person, place, and time.  Skin: Skin is warm and dry. He is not diaphoretic.  Nursing note and vitals  reviewed.   ED Course  Procedures  MDM Patient presents with abdominal discomfort with associated constipation for the past 5 days. VSS. Exam performed by initial provider unremarkable, no abdominal tenderness. Labs unremarkable. Patient given IV fluids in the ED. Due to patient with history of abdominal surgeries, constipation and abdominal pain, CT abdomen was ordered for further evaluation due to concern for bowel distraction. Dispo pending CT results.   Ct abdomen showed evidence suggesting gastritis, no bowel obstruction, nml appendix. On reevaluation patient is sitting resting comfortably in bed and reports mild intermittent abdominal pain. On my initial exam, patient with mild intermittent diffuse abdominal pain, no peritoneal signs. Pt tolerating PO. Suspect patient's symptoms are likely related to gastritis and mild constipation. Plan to discharge patient home with symptomatically treatment amp [place] [facility] follow-up. Discussed return precautions.       Barrett Henleadeau, Nicole Elizabeth, PA-C 02/27/17 0142    Ward, Layla MawKristen N, DO 02/27/17 [phone]

## 2017-02-27 NOTE — ED Notes (Signed)
Pt asking for food as soon as he returned from c-t

## 2017-02-27 NOTE — Discharge Instructions (Signed)
Take your medication as prescribed as needed for nausea and constipation. Continue drinking fluids at home to remain hydrated. I recommend eating a bland diet for the next few days until your symptoms have improved.  Please follow up with a primary care provider from the Resource Guide provided below in the next week if your symptoms continue. Please return to the Emergency Department if symptoms worsen or new onset of fever, chest pain, difficulty breathing, abdominal pain, vomiting, unable to have a bowel movement, rectal bleeding, unable to keep fluids down.
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
## 2017-02-27 NOTE — ED Notes (Signed)
Pt given [place] sandwich and ginger ale per [place] PA

## 2017-02-27 NOTE — ED Provider Notes (Signed)
[facility]-EMERGENCY DEPT Provider Note   CSN: [id number] Arrival date & time: 02/26/17  1814     History   Chief Complaint Chief Complaint  Patient presents with  . Abdominal Pain    HPI Phillip Chan is a 56 y.o. male.  HPI Patient presents to the emergency department with abdominal discomfort that started several days ago.  The patient states he feels like he is constipated, has not had a bowel movement since last Thursday.  Patient states he did take some over-the-counter laxatives and did have several bowel movements, but still feels discomfort.  He states that it comes in waves.  Nothing seems to make her condition better or worse. The patient denies chest pain, shortness of breath, headache,blurred vision, neck pain, fever, cough, weakness, numbness, dizziness, anorexia, edema,nausea, vomiting, diarrhea, rash, back pain, dysuria, hematemesis, bloody stool, near syncope, or syncope. Past Medical History:  Diagnosis Date  . Hypertension   . Stroke [facility])     There are no active problems to display for this patient.   Past Surgical History:  Procedure Laterality Date  . ABDOMINAL SURGERY    . BRAIN SURGERY    . NECK SURGERY         Home Medications    Prior to Admission medications   Medication Sig Start Date End Date Taking? Authorizing Provider  hydrochlorothiazide (HYDRODIURIL) 25 MG tablet Take 1 tablet (25 mg total) by mouth daily. 02/20/15  Yes Mirian Mo, MD  ibuprofen (ADVIL,MOTRIN) 600 MG tablet Take 1 tablet (600 mg total) by mouth every 6 (six) hours as needed. Patient taking differently: Take 600 mg by mouth every 6 (six) hours as needed for mild pain.  03/03/16  Yes Kirichenko, Tatyana, PA-C  NIFEdipine (PROCARDIA-XL/ADALAT CC) 60 MG 24 hr tablet Take 1 tablet (60 mg total) by mouth daily. 02/20/15  Yes Mirian Mo, MD  methocarbamol (ROBAXIN) 500 MG tablet Take 1 tablet (500 mg total) by mouth 2 (two) times daily. Patient not taking: Reported on  02/27/2017 03/03/16   Jaynie Crumble, PA-C    Family History History reviewed. No pertinent family history.  Social History Social History  Substance Use Topics  . Smoking status: Current Every Day Smoker    Packs/day: 0.50    Types: Cigarettes  . Smokeless tobacco: Never Used  . Alcohol use No     Allergies   Patient has no known allergies.   Review of Systems Review of Systems All other systems negative except as documented in the HPI. All pertinent positives and negatives as reviewed in the HPI.  Physical Exam Updated Vital Signs BP (!) 172/113   Pulse 87   Temp 98.1 F (36.7 C) (Oral)   Resp 18   Ht 5\' 7"  (1.702 m)   Wt 99.8 kg (220 lb)   SpO2 100%   BMI 34.46 kg/m   Physical Exam  Constitutional: He is oriented to person, place, and time. He appears well-developed and well-nourished. No distress.  HENT:  Head: Normocephalic and atraumatic.  Mouth/Throat: Oropharynx is clear and moist.  Eyes: Pupils are equal, round, and reactive to light.  Neck: Normal range of motion. Neck supple.  Cardiovascular: Normal rate, regular rhythm and normal heart sounds.  Exam reveals no gallop and no friction rub.   No murmur heard. Pulmonary/Chest: Effort normal and breath sounds normal. No respiratory distress. He has no wheezes.  Abdominal: Soft. Bowel sounds are normal. He exhibits no distension. There is no tenderness.  Neurological: He is alert and  oriented to person, place, and time. He exhibits normal muscle tone. Coordination normal.  Skin: Skin is warm and dry. Capillary refill takes less than 2 seconds. No rash noted. No erythema.  Psychiatric: He has a normal mood and affect. His behavior is normal.  Nursing note and vitals reviewed.    ED Treatments / Results  Labs (all labs ordered are listed, but only abnormal results are displayed) Labs Reviewed  COMPREHENSIVE METABOLIC PANEL - Abnormal; Notable for the following:       Result Value   Sodium 134 (*)      Potassium 3.3 (*)    Chloride 98 (*)    Glucose, Bld 102 (*)    Calcium 8.8 (*)    AST 46 (*)    All other components within normal limits  CBC - Abnormal; Notable for the following:    RBC 4.14 (*)    All other components within normal limits  URINALYSIS, ROUTINE W REFLEX MICROSCOPIC - Abnormal; Notable for the following:    APPearance CLOUDY (*)    Leukocytes, UA TRACE (*)    Bacteria, UA RARE (*)    All other components within normal limits  LIPASE, BLOOD    EKG  EKG Interpretation None       Radiology No results found.  Procedures Procedures (including critical care time)  Medications Ordered in ED Medications  sodium chloride 0.9 % bolus 1,000 mL (1,000 mLs Intravenous New Bag/Given 02/27/17 0018)  iopamidol (ISOVUE-300) 61 % injection (100 mLs  Contrast Given 02/26/17 2347)     Initial Impression / Assessment and Plan / ED Course  I have reviewed the triage vital signs and the nursing notes.  Pertinent labs & imaging results that were available during my care of the patient were reviewed by me and considered in my medical decision making (see chart for details).     On my examination, the patient did not have any pain at this time, but since he has had abdominal surgeries and with waxing and waning pain.  We did a CT scan of the abdomen.  Waiting for those results .  Final Clinical Impressions(s) / ED Diagnoses   Final diagnoses:  None    New Prescriptions New Prescriptions   No medications on file     Kyra MangesLawyer, Kenai Fluegel, PA-C 02/27/17 Pennie [id number]    Pickering, Nathan, MD 02/28/17 [phone]

## 2017-05-09 ENCOUNTER — Encounter (HOSPITAL_COMMUNITY): Payer: Self-pay | Admitting: Emergency Medicine

## 2017-05-09 ENCOUNTER — Emergency Department (HOSPITAL_COMMUNITY)
Admission: EM | Admit: 2017-05-09 | Discharge: 2017-05-09 | Disposition: A | Discharge status: Home / Self Care | Payer: Medicare Other | Attending: Emergency Medicine | Admitting: Emergency Medicine

## 2017-05-09 ENCOUNTER — Emergency Department (HOSPITAL_COMMUNITY): Payer: Medicare Other

## 2017-05-09 DIAGNOSIS — I10 Essential (primary) hypertension: Secondary | ICD-10-CM | POA: Insufficient documentation

## 2017-05-09 DIAGNOSIS — Y929 Unspecified place or not applicable: Secondary | ICD-10-CM | POA: Insufficient documentation

## 2017-05-09 DIAGNOSIS — Y999 Unspecified external cause status: Secondary | ICD-10-CM | POA: Diagnosis not present

## 2017-05-09 DIAGNOSIS — Z79899 Other long term (current) drug therapy: Secondary | ICD-10-CM | POA: Diagnosis not present

## 2017-05-09 DIAGNOSIS — S8261XA Displaced fracture of lateral malleolus of right fibula, initial encounter for closed fracture: Secondary | ICD-10-CM | POA: Insufficient documentation

## 2017-05-09 DIAGNOSIS — Z8673 Personal history of transient ischemic attack (TIA), and cerebral infarction without residual deficits: Secondary | ICD-10-CM | POA: Insufficient documentation

## 2017-05-09 DIAGNOSIS — S99811A Other specified injuries of right ankle, initial encounter: Secondary | ICD-10-CM | POA: Diagnosis present

## 2017-05-09 DIAGNOSIS — X509XXA Other and unspecified overexertion or strenuous movements or postures, initial encounter: Secondary | ICD-10-CM | POA: Diagnosis not present

## 2017-05-09 DIAGNOSIS — Y9355 Activity, bike riding: Secondary | ICD-10-CM | POA: Insufficient documentation

## 2017-05-09 DIAGNOSIS — F17210 Nicotine dependence, cigarettes, uncomplicated: Secondary | ICD-10-CM | POA: Insufficient documentation

## 2017-05-09 MED ORDER — OXYCODONE-ACETAMINOPHEN 5-325 MG PO TABS
1.0000 | ORAL_TABLET | Freq: Once | ORAL | Status: AC
  Administered 2017-05-09: 1 via ORAL
  Filled 2017-05-09: qty 1

## 2017-05-09 MED ORDER — HYDROCODONE-ACETAMINOPHEN 5-325 MG PO TABS: 1.0000 | ORAL_TABLET | ORAL | 0 refills | Status: AC | PRN
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
## 2017-05-09 NOTE — Discharge Instructions (Signed)
Take the prescribed medication as directed. Follow-up with orthopedics-- call to make an appt. Return to the ED for new or worsening symptoms.

## 2017-05-09 NOTE — ED Triage Notes (Signed)
Pt reports pain and swelling to right ankle after twisting it a few days ago, walking with limp into triage. nad

## 2017-05-21 DIAGNOSIS — S93491A Sprain of other ligament of right ankle, initial encounter: Secondary | ICD-10-CM | POA: Diagnosis not present
